# Patient Record
Sex: Female | Born: 1950
Health system: Southern US, Community
[De-identification: ages and names within clinical notes are randomized; demographics above are authoritative.]

## PROBLEM LIST (undated history)

## (undated) DIAGNOSIS — M810 Age-related osteoporosis without current pathological fracture: Secondary | ICD-10-CM

## (undated) DIAGNOSIS — H40009 Preglaucoma, unspecified, unspecified eye: Secondary | ICD-10-CM

## (undated) DIAGNOSIS — I480 Paroxysmal atrial fibrillation: Secondary | ICD-10-CM

## (undated) DIAGNOSIS — E785 Hyperlipidemia, unspecified: Secondary | ICD-10-CM

## (undated) DIAGNOSIS — I38 Endocarditis, valve unspecified: Secondary | ICD-10-CM

## (undated) DIAGNOSIS — Z789 Other specified health status: Secondary | ICD-10-CM

## (undated) HISTORY — PX: TONSILLECTOMY: SUR1361

## (undated) HISTORY — DX: Preglaucoma, unspecified, unspecified eye: H40.009

## (undated) HISTORY — DX: Endocarditis, valve unspecified: I38

## (undated) HISTORY — PX: OTHER SURGICAL HISTORY: SHX169

## (undated) HISTORY — DX: Other specified health status: Z78.9

## (undated) HISTORY — DX: Paroxysmal atrial fibrillation: I48.0

---

## 2001-09-15 ENCOUNTER — Ambulatory Visit (HOSPITAL_COMMUNITY): Admission: RE | Admit: 2001-09-15 | Discharge: 2001-09-15 | Payer: Self-pay | Admitting: Obstetrics and Gynecology

## 2001-09-15 ENCOUNTER — Encounter: Payer: Self-pay | Admitting: Obstetrics and Gynecology

## 2002-11-11 ENCOUNTER — Encounter: Payer: Self-pay | Admitting: Obstetrics and Gynecology

## 2002-11-11 ENCOUNTER — Ambulatory Visit (HOSPITAL_COMMUNITY): Admission: RE | Admit: 2002-11-11 | Discharge: 2002-11-11 | Payer: Self-pay | Admitting: Obstetrics and Gynecology

## 2003-05-06 ENCOUNTER — Other Ambulatory Visit: Admission: RE | Admit: 2003-05-06 | Discharge: 2003-05-06 | Payer: Self-pay | Admitting: Unknown Physician Specialty

## 2003-11-04 ENCOUNTER — Other Ambulatory Visit: Admission: RE | Admit: 2003-11-04 | Discharge: 2003-11-04 | Payer: Self-pay | Admitting: Unknown Physician Specialty

## 2004-03-16 ENCOUNTER — Ambulatory Visit (HOSPITAL_COMMUNITY): Admission: RE | Admit: 2004-03-16 | Discharge: 2004-03-16 | Payer: Self-pay | Admitting: Obstetrics and Gynecology

## 2005-03-20 ENCOUNTER — Ambulatory Visit (HOSPITAL_COMMUNITY): Admission: RE | Admit: 2005-03-20 | Discharge: 2005-03-20 | Payer: Self-pay | Admitting: Obstetrics and Gynecology

## 2005-10-01 ENCOUNTER — Ambulatory Visit: Payer: Self-pay | Admitting: Internal Medicine

## 2005-10-01 ENCOUNTER — Ambulatory Visit (HOSPITAL_COMMUNITY): Admission: RE | Admit: 2005-10-01 | Discharge: 2005-10-01 | Payer: Self-pay | Admitting: Internal Medicine

## 2006-03-26 ENCOUNTER — Ambulatory Visit (HOSPITAL_COMMUNITY): Admission: RE | Admit: 2006-03-26 | Discharge: 2006-03-26 | Payer: Self-pay | Admitting: Obstetrics and Gynecology

## 2007-03-31 ENCOUNTER — Ambulatory Visit (HOSPITAL_COMMUNITY): Admission: RE | Admit: 2007-03-31 | Discharge: 2007-03-31 | Payer: Self-pay | Admitting: Internal Medicine

## 2008-04-13 ENCOUNTER — Ambulatory Visit (HOSPITAL_COMMUNITY): Admission: RE | Admit: 2008-04-13 | Discharge: 2008-04-13 | Payer: Self-pay | Admitting: Internal Medicine

## 2009-04-18 ENCOUNTER — Ambulatory Visit (HOSPITAL_COMMUNITY): Admission: RE | Admit: 2009-04-18 | Discharge: 2009-04-18 | Payer: Self-pay | Admitting: Internal Medicine

## 2010-05-01 ENCOUNTER — Ambulatory Visit (HOSPITAL_COMMUNITY): Admission: RE | Admit: 2010-05-01 | Discharge: 2010-05-01 | Payer: Self-pay | Admitting: Internal Medicine

## 2011-05-09 ENCOUNTER — Other Ambulatory Visit (HOSPITAL_COMMUNITY): Payer: Self-pay | Admitting: Internal Medicine

## 2011-05-09 DIAGNOSIS — Z139 Encounter for screening, unspecified: Secondary | ICD-10-CM

## 2011-05-11 ENCOUNTER — Ambulatory Visit (HOSPITAL_COMMUNITY)
Admission: RE | Admit: 2011-05-11 | Discharge: 2011-05-11 | Disposition: A | Discharge status: Home / Self Care | Payer: Federal, State, Local not specified - PPO | Source: Ambulatory Visit | Attending: Internal Medicine | Admitting: Internal Medicine

## 2011-05-11 DIAGNOSIS — Z139 Encounter for screening, unspecified: Secondary | ICD-10-CM

## 2011-05-11 DIAGNOSIS — Z1231 Encounter for screening mammogram for malignant neoplasm of breast: Secondary | ICD-10-CM | POA: Insufficient documentation

## 2011-05-18 NOTE — Op Note (Signed)
NAME:  Gloria Stewart, Gloria Stewart              ACCOUNT NO.:  [id number]   MEDICAL RECORD NO.:  [id number]          PATIENT TYPE:  AMB   LOCATION:  DAY                           FACILITY:  [facility]   PHYSICIAN:  R. Roetta Sessions, M.D. DATE OF BIRTH:  January 15, 1951   DATE OF PROCEDURE:  10/01/2005  DATE OF DISCHARGE:                                 OPERATIVE REPORT   PROCEDURE:  Screening colonoscopy.   ENDOSCOPIST:  Jonathon Bellows, M.D.   INDICATION FOR PROCEDURE:  The patient is a 60 year old lady sent over at  the courtesy of Dr. Elvina Sidle for colorectal cancer screening.  She is  devoid of any lower GI tract symptoms.  There is no family history of  colorectal neoplasia.  She has never had a colonoscopy previously.  Colonoscopy is now being done as a screening maneuver.  This approach has  been discussed with the patient at length.  Potential risks, benefits, and  alternatives have been reviewed and questions answered.  She is agreeable to  proceed.  Documentation in the medical record.   DESCRIPTION OF PROCEDURE:  O2 saturation, blood pressure, pulse, and  respirations were monitored throughout the entire procedure.  Conscious  sedation with Versed 5 mg IV, Demerol 100 mg IV in divided doses.   INSTRUMENT:  Olympus video chip system.   FINDINGS:  Digital rectal exam revealed no abnormalities.   ENDOSCOPIC FINDINGS:  Prep was good.  Rectum:  Examination of the rectal  mucosa included retroflexed view of the anal verge that revealed no  abnormalities.  Colonic mucosa was surveyed from the rectosigmoid junction  through the left transverse, right colon, appendiceal orifice, ileocecal  valve, and cecum.  These structures were biopsied and photographed for the  record.  From this level, the scope was slowly withdrawn.  All previously  mentioned mucosal surfaces were again seen.  The patient was noted to have a  sigmoid diverticulum.  The remainder of colonic mucosa appeared normal.  The  patient tolerated the procedure well, was reactive in endoscopy.   IMPRESSION:  1.  Normal rectum.  2.  Sigmoid diverticula.  3.  The remainder of colonic mucosa appeared normal.   RECOMMENDATIONS:  1.  Diverticulosis literature given to Ms. Clovis Riley.  2.  Repeat screening colonoscopy in 10 years.      Jonathon Bellows, M.D.  Electronically Signed     RMR/MEDQ  D:  10/01/2005  T:  10/01/2005  Job:  [id number]   cc:   Elvina Sidle, M.D.  Fax: [phone]

## 2012-05-12 ENCOUNTER — Other Ambulatory Visit (HOSPITAL_COMMUNITY): Payer: Self-pay | Admitting: Internal Medicine

## 2012-05-12 DIAGNOSIS — Z139 Encounter for screening, unspecified: Secondary | ICD-10-CM

## 2012-05-13 ENCOUNTER — Ambulatory Visit (HOSPITAL_COMMUNITY)
Admission: RE | Admit: 2012-05-13 | Discharge: 2012-05-13 | Disposition: A | Discharge status: Home / Self Care | Payer: Federal, State, Local not specified - PPO | Source: Ambulatory Visit | Attending: Internal Medicine | Admitting: Internal Medicine

## 2012-05-13 DIAGNOSIS — Z139 Encounter for screening, unspecified: Secondary | ICD-10-CM

## 2012-05-13 DIAGNOSIS — Z1231 Encounter for screening mammogram for malignant neoplasm of breast: Secondary | ICD-10-CM | POA: Insufficient documentation

## 2013-05-18 ENCOUNTER — Other Ambulatory Visit (HOSPITAL_COMMUNITY): Payer: Self-pay | Admitting: Internal Medicine

## 2013-05-18 DIAGNOSIS — Z139 Encounter for screening, unspecified: Secondary | ICD-10-CM

## 2013-05-21 ENCOUNTER — Other Ambulatory Visit (HOSPITAL_COMMUNITY): Payer: Self-pay | Admitting: Internal Medicine

## 2013-05-21 ENCOUNTER — Ambulatory Visit (HOSPITAL_COMMUNITY)
Admission: RE | Admit: 2013-05-21 | Discharge: 2013-05-21 | Disposition: A | Discharge status: Home / Self Care | Payer: Federal, State, Local not specified - PPO | Source: Ambulatory Visit | Attending: Internal Medicine | Admitting: Internal Medicine

## 2013-05-21 DIAGNOSIS — Z139 Encounter for screening, unspecified: Secondary | ICD-10-CM

## 2013-05-21 DIAGNOSIS — M810 Age-related osteoporosis without current pathological fracture: Secondary | ICD-10-CM

## 2013-05-21 DIAGNOSIS — Z1231 Encounter for screening mammogram for malignant neoplasm of breast: Secondary | ICD-10-CM | POA: Insufficient documentation

## 2013-05-27 ENCOUNTER — Ambulatory Visit (HOSPITAL_COMMUNITY)
Admission: RE | Admit: 2013-05-27 | Discharge: 2013-05-27 | Disposition: A | Discharge status: Home / Self Care | Payer: Federal, State, Local not specified - PPO | Source: Ambulatory Visit | Attending: Internal Medicine | Admitting: Internal Medicine

## 2013-05-27 DIAGNOSIS — M949 Disorder of cartilage, unspecified: Secondary | ICD-10-CM | POA: Insufficient documentation

## 2013-05-27 DIAGNOSIS — M810 Age-related osteoporosis without current pathological fracture: Secondary | ICD-10-CM

## 2013-05-27 DIAGNOSIS — M899 Disorder of bone, unspecified: Secondary | ICD-10-CM | POA: Insufficient documentation

## 2014-06-01 ENCOUNTER — Other Ambulatory Visit (HOSPITAL_COMMUNITY): Payer: Self-pay | Admitting: Internal Medicine

## 2014-06-01 DIAGNOSIS — Z1231 Encounter for screening mammogram for malignant neoplasm of breast: Secondary | ICD-10-CM

## 2014-06-03 ENCOUNTER — Ambulatory Visit (HOSPITAL_COMMUNITY)
Admission: RE | Admit: 2014-06-03 | Discharge: 2014-06-03 | Disposition: A | Discharge status: Home / Self Care | Payer: Federal, State, Local not specified - PPO | Source: Ambulatory Visit | Attending: Internal Medicine | Admitting: Internal Medicine

## 2014-06-03 DIAGNOSIS — Z1231 Encounter for screening mammogram for malignant neoplasm of breast: Secondary | ICD-10-CM

## 2015-06-24 ENCOUNTER — Other Ambulatory Visit (HOSPITAL_COMMUNITY): Payer: Self-pay | Admitting: Internal Medicine

## 2015-06-24 DIAGNOSIS — Z1231 Encounter for screening mammogram for malignant neoplasm of breast: Secondary | ICD-10-CM

## 2015-06-29 ENCOUNTER — Ambulatory Visit (HOSPITAL_COMMUNITY)
Admission: RE | Admit: 2015-06-29 | Discharge: 2015-06-29 | Disposition: A | Discharge status: Home / Self Care | Payer: Federal, State, Local not specified - PPO | Source: Ambulatory Visit | Attending: Internal Medicine | Admitting: Internal Medicine

## 2015-06-29 DIAGNOSIS — Z1231 Encounter for screening mammogram for malignant neoplasm of breast: Secondary | ICD-10-CM | POA: Diagnosis not present

## 2015-11-01 DIAGNOSIS — H18899 Other specified disorders of cornea, unspecified eye: Secondary | ICD-10-CM | POA: Diagnosis not present

## 2015-11-01 DIAGNOSIS — H43819 Vitreous degeneration, unspecified eye: Secondary | ICD-10-CM | POA: Diagnosis not present

## 2015-11-01 DIAGNOSIS — H40009 Preglaucoma, unspecified, unspecified eye: Secondary | ICD-10-CM | POA: Diagnosis not present

## 2015-11-01 DIAGNOSIS — H52223 Regular astigmatism, bilateral: Secondary | ICD-10-CM | POA: Diagnosis not present

## 2015-11-01 DIAGNOSIS — H5203 Hypermetropia, bilateral: Secondary | ICD-10-CM | POA: Diagnosis not present

## 2015-11-01 DIAGNOSIS — H43813 Vitreous degeneration, bilateral: Secondary | ICD-10-CM | POA: Diagnosis not present

## 2015-11-01 DIAGNOSIS — H524 Presbyopia: Secondary | ICD-10-CM | POA: Diagnosis not present

## 2015-11-11 DIAGNOSIS — R7301 Impaired fasting glucose: Secondary | ICD-10-CM | POA: Diagnosis not present

## 2015-11-11 DIAGNOSIS — E559 Vitamin D deficiency, unspecified: Secondary | ICD-10-CM | POA: Diagnosis not present

## 2015-11-11 DIAGNOSIS — E785 Hyperlipidemia, unspecified: Secondary | ICD-10-CM | POA: Diagnosis not present

## 2015-11-11 DIAGNOSIS — Z131 Encounter for screening for diabetes mellitus: Secondary | ICD-10-CM | POA: Diagnosis not present

## 2015-11-16 DIAGNOSIS — Z2821 Immunization not carried out because of patient refusal: Secondary | ICD-10-CM | POA: Diagnosis not present

## 2015-11-16 DIAGNOSIS — E782 Mixed hyperlipidemia: Secondary | ICD-10-CM | POA: Diagnosis not present

## 2015-12-29 ENCOUNTER — Telehealth: Payer: Self-pay | Admitting: Internal Medicine

## 2015-12-29 NOTE — Telephone Encounter (Signed)
I called pt to triage for her colonoscopy. She said she has constipation and has a Bm about every 2 days and has to strain. Ov scheduled with Wynne DustEric Gill, NP on 01/17/2016.

## 2015-12-29 NOTE — Telephone Encounter (Signed)
Pt called to say that RMR did her colonoscopy 10 years ago and she hasn't heard from us to schedule her next one. I told her the triage nurse was at lunch and she does her triages from 130-430 and I would take her number so she could call her back. I have requested the last colonoscopy and [facility] because patient was not on our recall list and I don't see what the recommendations were from back then. Please call [phone]

## 2015-12-29 NOTE — Telephone Encounter (Signed)
The colonoscopy report is on your desk MelbourneDoris. I found it under 2012, She had it done 10/01/2005 and wasn't nic'ed for her 10 year due in October.

## 2016-01-17 ENCOUNTER — Encounter: Payer: Self-pay | Admitting: Nurse Practitioner

## 2016-01-17 ENCOUNTER — Other Ambulatory Visit: Payer: Self-pay

## 2016-01-17 ENCOUNTER — Ambulatory Visit (INDEPENDENT_AMBULATORY_CARE_PROVIDER_SITE_OTHER): Payer: Medicare Other | Admitting: Nurse Practitioner

## 2016-01-17 VITALS — BP 112/74 | HR 64 | Temp 97.0°F | Ht 69.0 in | Wt 153.0 lb

## 2016-01-17 DIAGNOSIS — Z1211 Encounter for screening for malignant neoplasm of colon: Secondary | ICD-10-CM

## 2016-01-17 DIAGNOSIS — K5900 Constipation, unspecified: Secondary | ICD-10-CM | POA: Diagnosis not present

## 2016-01-17 MED ORDER — PEG 3350-KCL-NA BICARB-NACL 420 G PO SOLR: 4000.0000 mL | ORAL | Status: DC

## 2016-01-17 NOTE — Assessment & Plan Note (Signed)
Patient with intermittent constipation over the holidays due to change routine and change in dietary habits. Now she is back to exercise regularly, drinking water, eating better foods which are high in fiber her constipation has resolved. Recommend over-the-counter MiraLAX or stool softeners as needed for intermittent constipation. Return for follow-up as needed.

## 2016-01-17 NOTE — Assessment & Plan Note (Signed)
Patient due for 10 year repeat screening colonoscopy. Last colonoscopy completed 10 years ago which was normal. This point we'll proceed with recommended screening colonoscopy.  Proceed with TCS with Dr. Jena Gauss in near future: the risks, benefits, and alternatives have been discussed with the patient in detail. The patient states understanding and desires to proceed.  The patient is not on any medications other than vitamins. Conscious sedation should be adequate for her procedure.

## 2016-01-17 NOTE — Progress Notes (Signed)
Primary Care Physician:  Dwana Melena, MD Primary Gastroenterologist:  Dr. Jena Gauss  Chief Complaint  Patient presents with  . Colonoscopy  . Constipation    HPI:   65 year old female presents for repeat screening colonoscopy. Last colonoscopy completed 10/01/2005 and found normal rectum, normal colonic mucosa. Recommend repeat screening colonoscopy in 10 years. Also noted to have sigmoid diverticula. She was attempted phone triage however she was brought in the office because of complaints of constipation.  Today she states she is no longer having constipation. States she was constipated around the holidays with an interruption in her normal routine. Now that she is back to walking, adequate water, and fiber she is no longer constipated. Denies abdominal pain, N/V, hematochezia, melena, fever, chills, unintentional weight loss. Denies chest pain, dyspnea, dizziness, lightheadedness, syncope, near syncope. Denies any other upper or lower GI symptoms.  Past Medical History  Diagnosis Date  . Medical history non-contributory     No PMH per patient.    Past Surgical History  Procedure Laterality Date  . None to date      01/17/16    Current Outpatient Prescriptions  Medication Sig Dispense Refill  . calcium carbonate (OS-CAL - DOSED IN MG OF ELEMENTAL CALCIUM) 1250 (500 Ca) MG tablet Take 1 tablet by mouth.    . cholecalciferol (VITAMIN D) 1000 units tablet Take 1,000 Units by mouth daily.     No current facility-administered medications for this visit.    Allergies as of 01/17/2016  . (No Known Allergies)    Family History  Problem Relation Age of Onset  . Colon cancer Neg Hx     Social History   Social History  . Marital Status: Single    Spouse Name: N/A  . Number of Children: N/A  . Years of Education: N/A   Occupational History  . Not on file.   Social History Main Topics  . Smoking status: Former Smoker    Quit date: 01/16/1981  . Smokeless tobacco: Never  Used  . Alcohol Use: 0.0 oz/week    0 Standard drinks or equivalent per week     Comment: 3-4 glasses of wine a week  . Drug Use: No  . Sexual Activity: Not on file   Other Topics Concern  . Not on file   Social History Narrative  . No narrative on file    Review of Systems: General: Negative for anorexia, weight loss, fever, chills, fatigue, weakness. Eyes: Negative for vision changes.  ENT: Negative for hoarseness, difficulty swallowing. CV: Negative for chest pain, angina, palpitations, peripheral edema.  Respiratory: Negative for dyspnea at rest, cough, sputum, wheezing.  GI: See history of present illness. MS: Negative for joint pain, low back pain.  Derm: Negative for rash or itching.  Endo: Negative for unusual weight change.  Heme: Negative for bruising or bleeding.    Physical Exam: BP 112/74 mmHg  Pulse 64  Temp(Src) 97 F (36.1 C) (Oral)  Ht  (1.753 m)  Wt 153 lb (69.4 kg)  BMI 22.58 kg/m2 General:   Alert and oriented. Pleasant and cooperative. Well-nourished and well-developed.  Head:  Normocephalic and atraumatic. Eyes:  Without icterus, sclera clear and conjunctiva pink.  Ears:  Normal auditory acuity. Cardiovascular:  S1, S2 present without murmurs appreciated. Extremities without clubbing or edema. Respiratory:  Clear to auscultation bilaterally. No wheezes, rales, or rhonchi. No distress.  Gastrointestinal:  +BS, soft, non-tender and non-distended. No HSM noted. No guarding or rebound. No masses appreciated.  Rectal:  Deferred  Neurologic:  Alert and oriented x4;  grossly normal neurologically. Psych:  Alert and cooperative. Normal mood and affect. Heme/Lymph/Immune: No excessive bruising noted.    01/17/2016 2:48 PM

## 2016-01-17 NOTE — Patient Instructions (Signed)
1. We will schedule your procedure for you. 2. Continue doing all the great things are doing to help prevent your constipation from coming back.  3. Return for follow-up as needed.

## 2016-01-24 NOTE — Progress Notes (Signed)
CC'ED TO PCP 

## 2016-02-07 ENCOUNTER — Other Ambulatory Visit: Payer: Self-pay

## 2016-02-09 ENCOUNTER — Encounter (HOSPITAL_COMMUNITY)
Admission: RE | Disposition: A | Discharge status: Home / Self Care | Payer: Self-pay | Source: Ambulatory Visit | Attending: Internal Medicine

## 2016-02-09 ENCOUNTER — Encounter (HOSPITAL_COMMUNITY): Payer: Self-pay

## 2016-02-09 ENCOUNTER — Ambulatory Visit (HOSPITAL_COMMUNITY)
Admission: RE | Admit: 2016-02-09 | Discharge: 2016-02-09 | Disposition: A | Discharge status: Home / Self Care | Payer: Medicare Other | Source: Ambulatory Visit | Attending: Internal Medicine | Admitting: Internal Medicine

## 2016-02-09 DIAGNOSIS — Z87891 Personal history of nicotine dependence: Secondary | ICD-10-CM | POA: Diagnosis not present

## 2016-02-09 DIAGNOSIS — Z1211 Encounter for screening for malignant neoplasm of colon: Secondary | ICD-10-CM | POA: Diagnosis not present

## 2016-02-09 DIAGNOSIS — Q2733 Arteriovenous malformation of digestive system vessel: Secondary | ICD-10-CM | POA: Diagnosis not present

## 2016-02-09 DIAGNOSIS — K5730 Diverticulosis of large intestine without perforation or abscess without bleeding: Secondary | ICD-10-CM | POA: Insufficient documentation

## 2016-02-09 DIAGNOSIS — Q438 Other specified congenital malformations of intestine: Secondary | ICD-10-CM | POA: Diagnosis not present

## 2016-02-09 HISTORY — PX: COLONOSCOPY: SHX5424

## 2016-02-09 SURGERY — COLONOSCOPY
Anesthesia: Moderate Sedation

## 2016-02-09 MED ORDER — ONDANSETRON HCL 4 MG/2ML IJ SOLN
INTRAMUSCULAR | Status: AC
  Filled 2016-02-09: qty 2

## 2016-02-09 MED ORDER — ONDANSETRON HCL 4 MG/2ML IJ SOLN
INTRAMUSCULAR | Status: DC | PRN
  Administered 2016-02-09: 4 mg via INTRAVENOUS

## 2016-02-09 MED ORDER — SIMETHICONE 40 MG/0.6ML PO SUSP
ORAL | Status: DC | PRN
  Administered 2016-02-09: 2.5 mL

## 2016-02-09 MED ORDER — SODIUM CHLORIDE 0.9 % IV SOLN
INTRAVENOUS | Status: DC
  Administered 2016-02-09: 10:00:00 via INTRAVENOUS

## 2016-02-09 MED ORDER — MIDAZOLAM HCL 5 MG/5ML IJ SOLN
INTRAMUSCULAR | Status: AC
  Filled 2016-02-09: qty 10

## 2016-02-09 MED ORDER — MEPERIDINE HCL 100 MG/ML IJ SOLN
INTRAMUSCULAR | Status: AC
  Filled 2016-02-09: qty 2

## 2016-02-09 MED ORDER — MIDAZOLAM HCL 5 MG/5ML IJ SOLN
INTRAMUSCULAR | Status: DC | PRN
  Administered 2016-02-09: 1 mg via INTRAVENOUS
  Administered 2016-02-09: 2 mg via INTRAVENOUS
  Administered 2016-02-09: 1 mg via INTRAVENOUS
  Administered 2016-02-09: 2 mg via INTRAVENOUS
  Administered 2016-02-09: 1 mg via INTRAVENOUS

## 2016-02-09 MED ORDER — MEPERIDINE HCL 100 MG/ML IJ SOLN
INTRAMUSCULAR | Status: DC | PRN
  Administered 2016-02-09 (×2): 25 mg via INTRAVENOUS
  Administered 2016-02-09: 50 mg via INTRAVENOUS

## 2016-02-09 NOTE — Interval H&P Note (Signed)
History and Physical Interval Note:  02/09/2016 10:55 AM  Gloria Stewart  has presented today for surgery, with the diagnosis of SCREENING  The various methods of treatment have been discussed with the patient and family. After consideration of risks, benefits and other options for treatment, the patient has consented to  Procedure(s) with comments: COLONOSCOPY (N/A) - 900 - moved to 10:15 - office to notify as a surgical intervention .  The patient's history has been reviewed, patient examined, no change in status, stable for surgery.  I have reviewed the patient's chart and labs.  Questions were answered to the patient's satisfaction.     Buryl Bamber  No change. Average risk screening colonoscopy per plan.  The risks, benefits, limitations, alternatives and imponderables have been reviewed with the patient. Questions have been answered. All parties are agreeable.

## 2016-02-09 NOTE — H&P (View-Only) (Signed)
Primary Care Physician:  Dwana Melena, MD Primary Gastroenterologist:  Dr. Jena Gauss  Chief Complaint  Patient presents with  . Colonoscopy  . Constipation    HPI:   65 year old female presents for repeat screening colonoscopy. Last colonoscopy completed 10/01/2005 and found normal rectum, normal colonic mucosa. Recommend repeat screening colonoscopy in 10 years. Also noted to have sigmoid diverticula. She was attempted phone triage however she was brought in the office because of complaints of constipation.  Today she states she is no longer having constipation. States she was constipated around the holidays with an interruption in her normal routine. Now that she is back to walking, adequate water, and fiber she is no longer constipated. Denies abdominal pain, N/V, hematochezia, melena, fever, chills, unintentional weight loss. Denies chest pain, dyspnea, dizziness, lightheadedness, syncope, near syncope. Denies any other upper or lower GI symptoms.  Past Medical History  Diagnosis Date  . Medical history non-contributory     No PMH per patient.    Past Surgical History  Procedure Laterality Date  . None to date      01/17/16    Current Outpatient Prescriptions  Medication Sig Dispense Refill  . calcium carbonate (OS-CAL - DOSED IN MG OF ELEMENTAL CALCIUM) 1250 (500 Ca) MG tablet Take 1 tablet by mouth.    . cholecalciferol (VITAMIN D) 1000 units tablet Take 1,000 Units by mouth daily.     No current facility-administered medications for this visit.    Allergies as of 01/17/2016  . (No Known Allergies)    Family History  Problem Relation Age of Onset  . Colon cancer Neg Hx     Social History   Social History  . Marital Status: Single    Spouse Name: N/A  . Number of Children: N/A  . Years of Education: N/A   Occupational History  . Not on file.   Social History Main Topics  . Smoking status: Former Smoker    Quit date: 01/16/1981  . Smokeless tobacco: Never  Used  . Alcohol Use: 0.0 oz/week    0 Standard drinks or equivalent per week     Comment: 3-4 glasses of wine a week  . Drug Use: No  . Sexual Activity: Not on file   Other Topics Concern  . Not on file   Social History Narrative  . No narrative on file    Review of Systems: General: Negative for anorexia, weight loss, fever, chills, fatigue, weakness. Eyes: Negative for vision changes.  ENT: Negative for hoarseness, difficulty swallowing. CV: Negative for chest pain, angina, palpitations, peripheral edema.  Respiratory: Negative for dyspnea at rest, cough, sputum, wheezing.  GI: See history of present illness. MS: Negative for joint pain, low back pain.  Derm: Negative for rash or itching.  Endo: Negative for unusual weight change.  Heme: Negative for bruising or bleeding.    Physical Exam: BP 112/74 mmHg  Pulse 64  Temp(Src) 97 F (36.1 C) (Oral)  Ht  (1.753 m)  Wt 153 lb (69.4 kg)  BMI 22.58 kg/m2 General:   Alert and oriented. Pleasant and cooperative. Well-nourished and well-developed.  Head:  Normocephalic and atraumatic. Eyes:  Without icterus, sclera clear and conjunctiva pink.  Ears:  Normal auditory acuity. Cardiovascular:  S1, S2 present without murmurs appreciated. Extremities without clubbing or edema. Respiratory:  Clear to auscultation bilaterally. No wheezes, rales, or rhonchi. No distress.  Gastrointestinal:  +BS, soft, non-tender and non-distended. No HSM noted. No guarding or rebound. No masses appreciated.  Rectal:  Deferred  Neurologic:  Alert and oriented x4;  grossly normal neurologically. Psych:  Alert and cooperative. Normal mood and affect. Heme/Lymph/Immune: No excessive bruising noted.    01/17/2016 2:48 PM

## 2016-02-09 NOTE — Op Note (Signed)
[facility] [address] [place], [address]   COLONOSCOPY PROCEDURE REPORT  PATIENT: Gloria Stewart, Gloria Stewart  MR#: [id number] BIRTHDATE: [birth year] , 64  yrs. old GENDER: female ENDOSCOPIST: R.  Roetta Sessions, MD FACP [facility] REFERRED XB:JYNW Hall, M.D. PROCEDURE DATE:  02-28-16 PROCEDURE:   Colonoscopy, screening INDICATIONS:Average risk colorectal cancer screening examination. MEDICATIONS: Versed 7 mg IV and Demerol 100 mg IV in divided doses. Zofran 4 mg IV. ASA CLASS:       Class II  CONSENT: The risks, benefits, alternatives and imponderables including but not limited to bleeding, perforation as well as the possibility of a missed lesion have been reviewed.  The potential for biopsy, lesion removal, etc. have also been discussed. Questions have been answered.  All parties agreeable.  Please see the history and physical in the medical record for more information.  DESCRIPTION OF PROCEDURE:   After the risks benefits and alternatives of the procedure were thoroughly explained, informed consent was obtained.  The digital rectal exam revealed no abnormalities of the rectum.   The EC-[id number] ([id number])  endoscope was introduced through the anus and advanced to the cecum, which was identified by both the appendix and ileocecal valve. No adverse events experienced.   The quality of the prep was adequate  The instrument was then slowly withdrawn as the colon was fully examined. Estimated blood loss is zero unless otherwise noted in this procedure report.      COLON FINDINGS: Normal-appearing rectal mucosa.  Scattered sigmoid diverticula; (1) 3 mm AVM in the descending colon.  The remainder of the colonic mucosa appeared normal.  Colon was redundant requiring changing of the patient's position and external abdominal pressure to reach the cecum.  Retroflexion was performed. .  Withdrawal time=8 minutes 0 seconds.  The scope was withdrawn and the procedure  completed. COMPLICATIONS: There were no immediate complications.  ENDOSCOPIC IMPRESSION: Colonic diverticulosis. Redundant colon.  RECOMMENDATIONS: Return for 1 more screening colonoscopy in 10 years  eSigned:  R. Roetta Sessions, MD Jerrel Ivory [facility] 02/28/16 11:33 AM   cc:  CPT CODES: ICD CODES:  The ICD and CPT codes recommended by this software are interpretations from the data that the clinical staff has captured with the software.  The verification of the translation of this report to the ICD and CPT codes and modifiers is the sole responsibility of the health care institution and practicing physician where this report was generated.  [employer] will not be held responsible for the validity of the ICD and CPT codes included on this report.  AMA assumes no liability for data contained or not contained herein. CPT is a Publishing rights manager of the [employer].  PATIENT NAME:  [name], [name] MR#: [id number]

## 2016-02-09 NOTE — Discharge Instructions (Signed)
Colonoscopy Discharge Instructions  Read the instructions outlined below and refer to this sheet in the next few weeks. These discharge instructions provide you with general information on caring for yourself after you leave the hospital. Your doctor may also give you specific instructions. While your treatment has been planned according to the most current medical practices available, unavoidable complications occasionally occur. If you have any problems or questions after discharge, call Dr. Jena Gauss at [phone]. ACTIVITY  You may resume your regular activity, but move at a slower pace for the next 24 hours.   Take frequent rest periods for the next 24 hours.   Walking will help get rid of the air and reduce the bloated feeling in your belly (abdomen).   No driving for 24 hours (because of the medicine (anesthesia) used during the test).    Do not sign any important legal documents or operate any machinery for 24 hours (because of the anesthesia used during the test).  NUTRITION  Drink plenty of fluids.   You may resume your normal diet as instructed by your doctor.   Begin with a light meal and progress to your normal diet. Heavy or fried foods are harder to digest and may make you feel sick to your stomach (nauseated).   Avoid alcoholic beverages for 24 hours or as instructed.  MEDICATIONS  You may resume your normal medications unless your doctor tells you otherwise.  WHAT YOU CAN EXPECT TODAY  Some feelings of bloating in the abdomen.   Passage of more gas than usual.   Spotting of blood in your stool or on the toilet paper.  IF YOU HAD POLYPS REMOVED DURING THE COLONOSCOPY:  No aspirin products for 7 days or as instructed.   No alcohol for 7 days or as instructed.   Eat a soft diet for the next 24 hours.  FINDING OUT THE RESULTS OF YOUR TEST Not all test results are available during your visit. If your test results are not back during the visit, make an appointment  with your caregiver to find out the results. Do not assume everything is normal if you have not heard from your caregiver or the medical facility. It is important for you to follow up on all of your test results.  SEEK IMMEDIATE MEDICAL ATTENTION IF:  You have more than a spotting of blood in your stool.   Your belly is swollen (abdominal distention).   You are nauseated or vomiting.   You have a temperature over 101.   You have abdominal pain or discomfort that is severe or gets worse throughout the day.    You have colonic diverticulosis. Information on diverticulosis provided  No polyps found today  Return for 1 more screening colonoscopy in 10 years   Diverticulosis Diverticulosis is the condition that develops when small pouches (diverticula) form in the wall of your colon. Your colon, or large intestine, is where water is absorbed and stool is formed. The pouches form when the inside layer of your colon pushes through weak spots in the outer layers of your colon. CAUSES  No one knows exactly what causes diverticulosis. RISK FACTORS  Being older than 50. Your risk for this condition increases with age. Diverticulosis is rare in people younger than 40 years. By age 12, almost everyone has it.  Eating a low-fiber diet.  Being frequently constipated.  Being overweight.  Not getting enough exercise.  Smoking.  Taking over-the-counter pain medicines, like aspirin and ibuprofen. SYMPTOMS  Most people  with diverticulosis do not have symptoms. DIAGNOSIS  Because diverticulosis often has no symptoms, health care providers often discover the condition during an exam for other colon problems. In many cases, a health care provider will diagnose diverticulosis while using a flexible scope to examine the colon (colonoscopy). TREATMENT  If you have never developed an infection related to diverticulosis, you may not need treatment. If you have had an infection before, treatment may  include:  Eating more fruits, vegetables, and grains.  Taking a fiber supplement.  Taking a live bacteria supplement (probiotic).  Taking medicine to relax your colon. HOME CARE INSTRUCTIONS   Drink at least 6-8 glasses of water each day to prevent constipation.  Try not to strain when you have a bowel movement.  Keep all follow-up appointments. If you have had an infection before:  Increase the fiber in your diet as directed by your health care provider or dietitian.  Take a dietary fiber supplement if your health care provider approves.  Only take medicines as directed by your health care provider. SEEK MEDICAL CARE IF:   You have abdominal pain.  You have bloating.  You have cramps.  You have not gone to the bathroom in 3 days. SEEK IMMEDIATE MEDICAL CARE IF:   Your pain gets worse.  Yourbloating becomes very bad.  You have a fever or chills, and your symptoms suddenly get worse.  You begin vomiting.  You have bowel movements that are bloody or black. MAKE SURE YOU:  Understand these instructions.  Will watch your condition.  Will get help right away if you are not doing well or get worse.   This information is not intended to replace advice given to you by your health care provider. Make sure you discuss any questions you have with your health care provider.   Document Released: 09/13/2004 Document Revised: 12/22/2013 Document Reviewed: 11/11/2013 Elsevier Interactive Patient Education [employer]

## 2016-02-10 ENCOUNTER — Encounter (HOSPITAL_COMMUNITY): Payer: Self-pay | Admitting: Internal Medicine

## 2016-05-23 DIAGNOSIS — E559 Vitamin D deficiency, unspecified: Secondary | ICD-10-CM | POA: Diagnosis not present

## 2016-05-23 DIAGNOSIS — E782 Mixed hyperlipidemia: Secondary | ICD-10-CM | POA: Diagnosis not present

## 2016-05-25 DIAGNOSIS — E782 Mixed hyperlipidemia: Secondary | ICD-10-CM | POA: Diagnosis not present

## 2016-05-25 DIAGNOSIS — Z0000 Encounter for general adult medical examination without abnormal findings: Secondary | ICD-10-CM | POA: Diagnosis not present

## 2016-06-29 ENCOUNTER — Other Ambulatory Visit (HOSPITAL_COMMUNITY): Payer: Self-pay | Admitting: Internal Medicine

## 2016-06-29 DIAGNOSIS — Z1231 Encounter for screening mammogram for malignant neoplasm of breast: Secondary | ICD-10-CM

## 2016-07-04 ENCOUNTER — Ambulatory Visit (HOSPITAL_COMMUNITY)
Admission: RE | Admit: 2016-07-04 | Discharge: 2016-07-04 | Disposition: A | Discharge status: Home / Self Care | Payer: Medicare Other | Source: Ambulatory Visit | Attending: Internal Medicine | Admitting: Internal Medicine

## 2016-07-04 DIAGNOSIS — Z1231 Encounter for screening mammogram for malignant neoplasm of breast: Secondary | ICD-10-CM | POA: Insufficient documentation

## 2017-01-04 DIAGNOSIS — H524 Presbyopia: Secondary | ICD-10-CM | POA: Diagnosis not present

## 2017-01-04 DIAGNOSIS — H5203 Hypermetropia, bilateral: Secondary | ICD-10-CM | POA: Diagnosis not present

## 2017-01-04 DIAGNOSIS — H52223 Regular astigmatism, bilateral: Secondary | ICD-10-CM | POA: Diagnosis not present

## 2017-01-04 DIAGNOSIS — H40013 Open angle with borderline findings, low risk, bilateral: Secondary | ICD-10-CM | POA: Diagnosis not present

## 2017-06-20 DIAGNOSIS — L820 Inflamed seborrheic keratosis: Secondary | ICD-10-CM | POA: Diagnosis not present

## 2017-07-18 ENCOUNTER — Other Ambulatory Visit (HOSPITAL_COMMUNITY): Payer: Self-pay | Admitting: Internal Medicine

## 2017-07-18 DIAGNOSIS — Z1231 Encounter for screening mammogram for malignant neoplasm of breast: Secondary | ICD-10-CM

## 2017-07-25 ENCOUNTER — Ambulatory Visit (HOSPITAL_COMMUNITY)
Admission: RE | Admit: 2017-07-25 | Discharge: 2017-07-25 | Disposition: A | Discharge status: Home / Self Care | Payer: Medicare Other | Source: Ambulatory Visit | Attending: Internal Medicine | Admitting: Internal Medicine

## 2017-07-25 DIAGNOSIS — Z1231 Encounter for screening mammogram for malignant neoplasm of breast: Secondary | ICD-10-CM

## 2017-10-09 DIAGNOSIS — E782 Mixed hyperlipidemia: Secondary | ICD-10-CM | POA: Diagnosis not present

## 2017-10-09 DIAGNOSIS — Z0000 Encounter for general adult medical examination without abnormal findings: Secondary | ICD-10-CM | POA: Diagnosis not present

## 2017-10-09 DIAGNOSIS — E559 Vitamin D deficiency, unspecified: Secondary | ICD-10-CM | POA: Diagnosis not present

## 2017-10-11 DIAGNOSIS — Z0000 Encounter for general adult medical examination without abnormal findings: Secondary | ICD-10-CM | POA: Diagnosis not present

## 2017-10-11 DIAGNOSIS — Z23 Encounter for immunization: Secondary | ICD-10-CM | POA: Diagnosis not present

## 2017-10-11 DIAGNOSIS — Z6822 Body mass index (BMI) 22.0-22.9, adult: Secondary | ICD-10-CM | POA: Diagnosis not present

## 2017-10-11 DIAGNOSIS — M816 Localized osteoporosis [Lequesne]: Secondary | ICD-10-CM | POA: Diagnosis not present

## 2017-10-11 DIAGNOSIS — E559 Vitamin D deficiency, unspecified: Secondary | ICD-10-CM | POA: Diagnosis not present

## 2017-10-11 DIAGNOSIS — E782 Mixed hyperlipidemia: Secondary | ICD-10-CM | POA: Diagnosis not present

## 2018-02-04 DIAGNOSIS — H40003 Preglaucoma, unspecified, bilateral: Secondary | ICD-10-CM | POA: Diagnosis not present

## 2018-02-04 DIAGNOSIS — H18893 Other specified disorders of cornea, bilateral: Secondary | ICD-10-CM | POA: Diagnosis not present

## 2018-02-04 DIAGNOSIS — H43813 Vitreous degeneration, bilateral: Secondary | ICD-10-CM | POA: Diagnosis not present

## 2018-02-04 DIAGNOSIS — H40013 Open angle with borderline findings, low risk, bilateral: Secondary | ICD-10-CM | POA: Diagnosis not present

## 2018-03-24 DIAGNOSIS — Z6821 Body mass index (BMI) 21.0-21.9, adult: Secondary | ICD-10-CM | POA: Diagnosis not present

## 2018-03-24 DIAGNOSIS — J060 Acute laryngopharyngitis: Secondary | ICD-10-CM | POA: Diagnosis not present

## 2018-08-20 ENCOUNTER — Other Ambulatory Visit (HOSPITAL_COMMUNITY): Payer: Self-pay | Admitting: Internal Medicine

## 2018-08-20 DIAGNOSIS — Z1231 Encounter for screening mammogram for malignant neoplasm of breast: Secondary | ICD-10-CM

## 2018-08-27 ENCOUNTER — Ambulatory Visit (HOSPITAL_COMMUNITY)
Admission: RE | Admit: 2018-08-27 | Discharge: 2018-08-27 | Disposition: A | Discharge status: Home / Self Care | Payer: Medicare Other | Source: Ambulatory Visit | Attending: Internal Medicine | Admitting: Internal Medicine

## 2018-08-27 ENCOUNTER — Encounter (HOSPITAL_COMMUNITY): Payer: Self-pay

## 2018-08-27 DIAGNOSIS — Z1231 Encounter for screening mammogram for malignant neoplasm of breast: Secondary | ICD-10-CM | POA: Diagnosis not present

## 2019-02-05 DIAGNOSIS — Z6821 Body mass index (BMI) 21.0-21.9, adult: Secondary | ICD-10-CM | POA: Diagnosis not present

## 2019-02-11 DIAGNOSIS — E782 Mixed hyperlipidemia: Secondary | ICD-10-CM | POA: Diagnosis not present

## 2019-02-11 DIAGNOSIS — Z0000 Encounter for general adult medical examination without abnormal findings: Secondary | ICD-10-CM | POA: Diagnosis not present

## 2019-02-11 DIAGNOSIS — Z23 Encounter for immunization: Secondary | ICD-10-CM | POA: Diagnosis not present

## 2019-08-03 ENCOUNTER — Other Ambulatory Visit: Payer: Self-pay

## 2019-09-04 ENCOUNTER — Other Ambulatory Visit (HOSPITAL_COMMUNITY): Payer: Self-pay | Admitting: Internal Medicine

## 2019-09-04 DIAGNOSIS — Z1231 Encounter for screening mammogram for malignant neoplasm of breast: Secondary | ICD-10-CM

## 2019-09-18 ENCOUNTER — Ambulatory Visit (HOSPITAL_COMMUNITY)
Admission: RE | Admit: 2019-09-18 | Discharge: 2019-09-18 | Disposition: A | Discharge status: Home / Self Care | Payer: Medicare Other | Source: Ambulatory Visit | Attending: Internal Medicine | Admitting: Internal Medicine

## 2019-09-18 ENCOUNTER — Other Ambulatory Visit: Payer: Self-pay

## 2019-09-18 DIAGNOSIS — Z1231 Encounter for screening mammogram for malignant neoplasm of breast: Secondary | ICD-10-CM | POA: Insufficient documentation

## 2020-01-21 DIAGNOSIS — H40013 Open angle with borderline findings, low risk, bilateral: Secondary | ICD-10-CM | POA: Diagnosis not present

## 2020-01-29 ENCOUNTER — Ambulatory Visit: Payer: Medicare Other

## 2020-02-09 ENCOUNTER — Ambulatory Visit: Payer: Medicare Other

## 2020-08-15 DIAGNOSIS — E782 Mixed hyperlipidemia: Secondary | ICD-10-CM | POA: Diagnosis not present

## 2020-08-15 DIAGNOSIS — Z6821 Body mass index (BMI) 21.0-21.9, adult: Secondary | ICD-10-CM | POA: Diagnosis not present

## 2020-08-15 DIAGNOSIS — Z23 Encounter for immunization: Secondary | ICD-10-CM | POA: Diagnosis not present

## 2020-08-15 DIAGNOSIS — Z0000 Encounter for general adult medical examination without abnormal findings: Secondary | ICD-10-CM | POA: Diagnosis not present

## 2020-08-15 DIAGNOSIS — J060 Acute laryngopharyngitis: Secondary | ICD-10-CM | POA: Diagnosis not present

## 2020-08-17 ENCOUNTER — Other Ambulatory Visit (HOSPITAL_COMMUNITY): Payer: Self-pay | Admitting: Internal Medicine

## 2020-08-17 DIAGNOSIS — Z0000 Encounter for general adult medical examination without abnormal findings: Secondary | ICD-10-CM | POA: Diagnosis not present

## 2020-08-17 DIAGNOSIS — M810 Age-related osteoporosis without current pathological fracture: Secondary | ICD-10-CM

## 2020-08-17 DIAGNOSIS — E782 Mixed hyperlipidemia: Secondary | ICD-10-CM | POA: Diagnosis not present

## 2020-08-17 DIAGNOSIS — Z23 Encounter for immunization: Secondary | ICD-10-CM | POA: Diagnosis not present

## 2020-09-06 ENCOUNTER — Ambulatory Visit (HOSPITAL_COMMUNITY)
Admission: RE | Admit: 2020-09-06 | Discharge: 2020-09-06 | Disposition: A | Discharge status: Home / Self Care | Payer: Medicare Other | Source: Ambulatory Visit | Attending: Internal Medicine | Admitting: Internal Medicine

## 2020-09-06 ENCOUNTER — Other Ambulatory Visit: Payer: Self-pay

## 2020-09-06 DIAGNOSIS — Z780 Asymptomatic menopausal state: Secondary | ICD-10-CM | POA: Diagnosis not present

## 2020-09-06 DIAGNOSIS — M810 Age-related osteoporosis without current pathological fracture: Secondary | ICD-10-CM | POA: Diagnosis not present

## 2020-09-09 DIAGNOSIS — M810 Age-related osteoporosis without current pathological fracture: Secondary | ICD-10-CM | POA: Diagnosis not present

## 2020-09-19 ENCOUNTER — Other Ambulatory Visit (HOSPITAL_COMMUNITY): Payer: Self-pay | Admitting: Internal Medicine

## 2020-09-19 DIAGNOSIS — Z1231 Encounter for screening mammogram for malignant neoplasm of breast: Secondary | ICD-10-CM

## 2020-09-26 ENCOUNTER — Other Ambulatory Visit: Payer: Self-pay

## 2020-09-26 ENCOUNTER — Ambulatory Visit (HOSPITAL_COMMUNITY)
Admission: RE | Admit: 2020-09-26 | Discharge: 2020-09-26 | Disposition: A | Discharge status: Home / Self Care | Payer: Medicare Other | Source: Ambulatory Visit | Attending: Internal Medicine | Admitting: Internal Medicine

## 2020-09-26 DIAGNOSIS — Z1231 Encounter for screening mammogram for malignant neoplasm of breast: Secondary | ICD-10-CM | POA: Insufficient documentation

## 2020-10-19 ENCOUNTER — Encounter (HOSPITAL_COMMUNITY)
Admission: RE | Admit: 2020-10-19 | Discharge: 2020-10-19 | Disposition: A | Discharge status: Home / Self Care | Payer: Medicare Other | Source: Ambulatory Visit | Attending: Internal Medicine | Admitting: Internal Medicine

## 2020-10-19 ENCOUNTER — Other Ambulatory Visit: Payer: Self-pay

## 2020-10-19 ENCOUNTER — Encounter (HOSPITAL_COMMUNITY): Payer: Self-pay

## 2020-10-19 DIAGNOSIS — M810 Age-related osteoporosis without current pathological fracture: Secondary | ICD-10-CM | POA: Diagnosis not present

## 2020-10-19 HISTORY — DX: Age-related osteoporosis without current pathological fracture: M81.0

## 2020-10-19 HISTORY — DX: Hyperlipidemia, unspecified: E78.5

## 2020-10-19 MED ORDER — DENOSUMAB 60 MG/ML ~~LOC~~ SOSY
60.0000 mg | PREFILLED_SYRINGE | Freq: Once | SUBCUTANEOUS | Status: AC
  Administered 2020-10-19: 60 mg via SUBCUTANEOUS

## 2020-10-19 NOTE — Discharge Instructions (Signed)
Denosumab injection What is this medicine? DENOSUMAB (den oh sue mab) slows bone breakdown. Prolia is used to treat osteoporosis in women after menopause and in men, and in people who are taking corticosteroids for 6 months or more. Xgeva is used to treat a high calcium level due to cancer and to prevent bone fractures and other bone problems caused by multiple myeloma or cancer bone metastases. Xgeva is also used to treat giant cell tumor of the bone. This medicine may be used for other purposes; ask your health care provider or pharmacist if you have questions. COMMON BRAND NAME(S): Prolia, XGEVA What should I tell my health care provider before I take this medicine? They need to know if you have any of these conditions:  dental disease  having surgery or tooth extraction  infection  kidney disease  low levels of calcium or Vitamin D in the blood  malnutrition  on hemodialysis  skin conditions or sensitivity  thyroid or parathyroid disease  an unusual reaction to denosumab, other medicines, foods, dyes, or preservatives  pregnant or trying to get pregnant  breast-feeding How should I use this medicine? This medicine is for injection under the skin. It is given by a health care professional in a hospital or clinic setting. A special MedGuide will be given to you before each treatment. Be sure to read this information carefully each time. For Prolia, talk to your pediatrician regarding the use of this medicine in children. Special care may be needed. For Xgeva, talk to your pediatrician regarding the use of this medicine in children. While this drug may be prescribed for children as young as 13 years for selected conditions, precautions do apply. Overdosage: If you think you have taken too much of this medicine contact a poison control center or emergency room at once. NOTE: This medicine is only for you. Do not share this medicine with others. What if I miss a dose? It is  important not to miss your dose. Call your doctor or health care professional if you are unable to keep an appointment. What may interact with this medicine? Do not take this medicine with any of the following medications:  other medicines containing denosumab This medicine may also interact with the following medications:  medicines that lower your chance of fighting infection  steroid medicines like prednisone or cortisone This list may not describe all possible interactions. Give your health care provider a list of all the medicines, herbs, non-prescription drugs, or dietary supplements you use. Also tell them if you smoke, drink alcohol, or use illegal drugs. Some items may interact with your medicine. What should I watch for while using this medicine? Visit your doctor or health care professional for regular checks on your progress. Your doctor or health care professional may order blood tests and other tests to see how you are doing. Call your doctor or health care professional for advice if you get a fever, chills or sore throat, or other symptoms of a cold or flu. Do not treat yourself. This drug may decrease your body's ability to fight infection. Try to avoid being around people who are sick. You should make sure you get enough calcium and vitamin D while you are taking this medicine, unless your doctor tells you not to. Discuss the foods you eat and the vitamins you take with your health care professional. See your dentist regularly. Brush and floss your teeth as directed. Before you have any dental work done, tell your dentist you are   receiving this medicine. Do not become pregnant while taking this medicine or for 5 months after stopping it. Talk with your doctor or health care professional about your birth control options while taking this medicine. Women should inform their doctor if they wish to become pregnant or think they might be pregnant. There is a potential for serious side  effects to an unborn child. Talk to your health care professional or pharmacist for more information. What side effects may I notice from receiving this medicine? Side effects that you should report to your doctor or health care professional as soon as possible:  allergic reactions like skin rash, itching or hives, swelling of the face, lips, or tongue  bone pain  breathing problems  dizziness  jaw pain, especially after dental work  redness, blistering, peeling of the skin  signs and symptoms of infection like fever or chills; cough; sore throat; pain or trouble passing urine  signs of low calcium like fast heartbeat, muscle cramps or muscle pain; pain, tingling, numbness in the hands or feet; seizures  unusual bleeding or bruising  unusually weak or tired Side effects that usually do not require medical attention (report to your doctor or health care professional if they continue or are bothersome):  constipation  diarrhea  headache  joint pain  loss of appetite  muscle pain  runny nose  tiredness  upset stomach This list may not describe all possible side effects. Call your doctor for medical advice about side effects. You may report side effects to FDA at 1-800-FDA-1088. Where should I keep my medicine? This medicine is only given in a clinic, doctor's office, or other health care setting and will not be stored at home. NOTE: This sheet is a summary. It may not cover all possible information. If you have questions about this medicine, talk to your doctor, pharmacist, or health care provider.  2020 Elsevier/Gold Standard (2018-04-25 16:10:44)

## 2021-02-16 DIAGNOSIS — J060 Acute laryngopharyngitis: Secondary | ICD-10-CM | POA: Diagnosis not present

## 2021-02-16 DIAGNOSIS — Z23 Encounter for immunization: Secondary | ICD-10-CM | POA: Diagnosis not present

## 2021-02-16 DIAGNOSIS — M810 Age-related osteoporosis without current pathological fracture: Secondary | ICD-10-CM | POA: Diagnosis not present

## 2021-02-16 DIAGNOSIS — Z712 Person consulting for explanation of examination or test findings: Secondary | ICD-10-CM | POA: Diagnosis not present

## 2021-02-16 DIAGNOSIS — Z0000 Encounter for general adult medical examination without abnormal findings: Secondary | ICD-10-CM | POA: Diagnosis not present

## 2021-02-16 DIAGNOSIS — Z6821 Body mass index (BMI) 21.0-21.9, adult: Secondary | ICD-10-CM | POA: Diagnosis not present

## 2021-02-16 DIAGNOSIS — E782 Mixed hyperlipidemia: Secondary | ICD-10-CM | POA: Diagnosis not present

## 2021-02-16 DIAGNOSIS — Z1331 Encounter for screening for depression: Secondary | ICD-10-CM | POA: Diagnosis not present

## 2021-02-21 DIAGNOSIS — L659 Nonscarring hair loss, unspecified: Secondary | ICD-10-CM | POA: Diagnosis not present

## 2021-02-21 DIAGNOSIS — E782 Mixed hyperlipidemia: Secondary | ICD-10-CM | POA: Diagnosis not present

## 2021-02-21 DIAGNOSIS — R002 Palpitations: Secondary | ICD-10-CM | POA: Diagnosis not present

## 2021-04-19 ENCOUNTER — Encounter (HOSPITAL_COMMUNITY)
Admission: RE | Admit: 2021-04-19 | Discharge: 2021-04-19 | Disposition: A | Discharge status: Home / Self Care | Payer: Medicare Other | Source: Ambulatory Visit | Attending: Internal Medicine | Admitting: Internal Medicine

## 2021-04-19 ENCOUNTER — Other Ambulatory Visit: Payer: Self-pay

## 2021-04-19 ENCOUNTER — Encounter (HOSPITAL_COMMUNITY): Payer: Self-pay

## 2021-04-19 DIAGNOSIS — M810 Age-related osteoporosis without current pathological fracture: Secondary | ICD-10-CM | POA: Diagnosis not present

## 2021-04-19 MED ORDER — DENOSUMAB 60 MG/ML ~~LOC~~ SOSY: 60.0000 mg | PREFILLED_SYRINGE | Freq: Once | SUBCUTANEOUS | Status: DC

## 2021-04-19 MED ORDER — DENOSUMAB 60 MG/ML ~~LOC~~ SOSY
PREFILLED_SYRINGE | SUBCUTANEOUS | Status: AC
  Administered 2021-04-19: 60 mg via SUBCUTANEOUS
  Filled 2021-04-19: qty 1

## 2021-06-27 DIAGNOSIS — H40003 Preglaucoma, unspecified, bilateral: Secondary | ICD-10-CM | POA: Diagnosis not present

## 2021-07-17 DIAGNOSIS — R002 Palpitations: Secondary | ICD-10-CM | POA: Diagnosis not present

## 2021-08-21 ENCOUNTER — Encounter: Payer: Self-pay | Admitting: Cardiology

## 2021-08-21 DIAGNOSIS — E782 Mixed hyperlipidemia: Secondary | ICD-10-CM | POA: Diagnosis not present

## 2021-09-11 DIAGNOSIS — Z23 Encounter for immunization: Secondary | ICD-10-CM | POA: Diagnosis not present

## 2021-09-11 DIAGNOSIS — R002 Palpitations: Secondary | ICD-10-CM | POA: Diagnosis not present

## 2021-09-11 DIAGNOSIS — E782 Mixed hyperlipidemia: Secondary | ICD-10-CM | POA: Diagnosis not present

## 2021-09-11 DIAGNOSIS — Z0001 Encounter for general adult medical examination with abnormal findings: Secondary | ICD-10-CM | POA: Diagnosis not present

## 2021-09-22 ENCOUNTER — Encounter: Payer: Self-pay | Admitting: *Deleted

## 2021-09-22 ENCOUNTER — Ambulatory Visit (INDEPENDENT_AMBULATORY_CARE_PROVIDER_SITE_OTHER): Payer: Medicare Other | Admitting: Cardiology

## 2021-09-22 ENCOUNTER — Encounter: Payer: Self-pay | Admitting: Cardiology

## 2021-09-22 VITALS — BP 116/74 | HR 73 | Ht 67.5 in | Wt 147.0 lb

## 2021-09-22 DIAGNOSIS — R002 Palpitations: Secondary | ICD-10-CM | POA: Diagnosis not present

## 2021-09-22 DIAGNOSIS — R29818 Other symptoms and signs involving the nervous system: Secondary | ICD-10-CM | POA: Diagnosis not present

## 2021-09-22 DIAGNOSIS — I480 Paroxysmal atrial fibrillation: Secondary | ICD-10-CM | POA: Diagnosis not present

## 2021-09-22 DIAGNOSIS — E782 Mixed hyperlipidemia: Secondary | ICD-10-CM

## 2021-09-22 NOTE — Progress Notes (Signed)
Cardiology Office Note  Date: 09/22/2021   ID: Teirra, Carapia [birth year], MRN [id number]  PCP:  Benita Stabile, MD  Cardiologist:  Nona Dell, MD Electrophysiologist:  None   Chief Complaint  Patient presents with   Palpitations     History of Present Illness: Gloria Stewart is a 70 y.o. female referred for cardiology consultation by Ms.Gloria Norman NP with Dr. Margo Aye for evaluation of palpitations.  She tells me that over the last several months, essentially since this past winter, she has been experiencing intermittent palpitations described as an irregular heartbeat.  This has mostly occurred when she is still in the evenings, sometimes wakes her up, much less often during the daytime.  Episodes can last for several hours. She is active, walks 5 miles each day.  She retired from the [employer] 20 years ago, lives alone in [place].  She bought an Scientist, physiological back in June, since that time has gotten atrial fibrillation alerts which we reviewed today and are consistent with paroxysmal atrial fibrillation.  CHA2DS2-VASc score is at least 2.  We did discuss stroke risk with atrial fibrillation and use of DOAC's.  We discussed Eliquis today.  Heart rate is regular today, her most recent ECG showed sinus rhythm.  She has no history of known OSA, has never been tested.  She is uncertain whether she snores or not since she lives alone.  Past Medical History:  Diagnosis Date   Hyperlipidemia    Osteoporosis    Preglaucoma     Past Surgical History:  Procedure Laterality Date   COLONOSCOPY N/A 02/09/2016   Procedure: COLONOSCOPY;  Surgeon: Corbin Ade, MD;  Location: [facility];  Service: Endoscopy;  Laterality: N/A;  900 - moved to 10:15 - office to notify   TONSILLECTOMY     at 70 years old    Current Outpatient Medications  Medication Sig Dispense Refill   atorvastatin (LIPITOR) 10 MG tablet Take 10 mg by mouth as directed. Monday and Friday only     calcium carbonate  (OS-CAL - DOSED IN MG OF ELEMENTAL CALCIUM) 1250 (500 Ca) MG tablet Take 1 tablet by mouth.     cholecalciferol (VITAMIN D) 1000 units tablet Take 1,000 Units by mouth daily.     denosumab (PROLIA) 60 MG/ML SOSY injection Inject 60 mg into the skin every 6 (six) months.     No current facility-administered medications for this visit.   Allergies:  Patient has no known allergies.   Social History: The patient  reports that she quit smoking about 40 years ago. Her smoking use included cigarettes. She has never used smokeless tobacco. She reports current alcohol use. She reports that she does not use drugs.   Family History: The patient's family history includes Cirrhosis in her father; Hodgkin's lymphoma in her mother.   ROS: No dizziness or syncope.  Physical Exam: VS:  BP 116/74   Pulse 73   Ht 5' 7.5" (1.715 m)   Wt 147 lb (66.7 kg)   SpO2 98%   BMI 22.68 kg/m , BMI Body mass index is 22.68 kg/m.  Wt Readings from Last 3 Encounters:  09/22/21 147 lb (66.7 kg)  04/19/21 150 lb (68 kg)  10/19/20 143 lb (64.9 kg)    General: Patient appears comfortable at rest. HEENT: Conjunctiva and lids normal, wearing a mask. Neck: Supple, no elevated JVP or carotid bruits, no thyromegaly. Lungs: Clear to auscultation, nonlabored breathing at rest. Cardiac: Regular rate and rhythm,  no S3 or significant systolic murmur, no pericardial rub. Abdomen: Soft, bowel sounds present. Extremities: No pitting edema, distal pulses 2+. Skin: Warm and dry. Musculoskeletal: No kyphosis. Neuropsychiatric: Alert and oriented x3, affect grossly appropriate.  ECG:  An ECG dated July 2022 was personally reviewed today and demonstrated:  Incomplete faxed tracing shows a sinus rhythm.  Recent Labwork:  No lab work available for review today.  Other Studies Reviewed Today:  No prior cardiac testing for review today.  Assessment and Plan:  1.  Paroxysmal atrial fibrillation based on review of telemetry  captured by her Apple Watch.  CHA2DS2-VASc score is at least 2 and we discussed stroke risk.  Plan is to obtain her recent lab work from Dr. Scharlene Gloss office and initiate Eliquis for stroke prophylaxis.  7-day Zio patch will also be placed to better assess rhythm frequency to determine whether we approach this with AV nodal blockers versus antiarrhythmic therapy.  Obtain echocardiogram for cardiac structural assessment.  Also refer to Dr. Craige Cotta for OSA evaluation.  2.  Mixed hyperlipidemia, currently on low-dose Lipitor.  Medication Adjustments/Labs and Tests Ordered: Current medicines are reviewed at length with the patient today.  Concerns regarding medicines are outlined above.   Tests Ordered: Orders Placed This Encounter  Procedures   Ambulatory referral to Pulmonology   ECHOCARDIOGRAM COMPLETE     Medication Changes: No orders of the defined types were placed in this encounter.   Disposition:  Follow up  6 weeks.  Signed, Jonelle Sidle, MD, [facility] 09/22/2021 2:05 PM    [facility] [address] [place], [place], [place] [address] Phone: [phone]; Fax: [phone]

## 2021-09-22 NOTE — Patient Instructions (Addendum)
Medication Instructions:  Your physician recommends that you continue on your current medications as directed. Please refer to the Current Medication list given to you today.  Labwork: none  Testing/Procedures: Your physician has recommended that you wear an 7 day event monitor. Event monitors are medical devices that record the heart's electrical activity. Doctors most often [place] these monitors to diagnose arrhythmias. Arrhythmias are problems with the speed or rhythm of the heartbeat. The monitor is a small, portable device. You can wear one while you do your normal daily activities. This is usually used to diagnose what is causing palpitations/syncope (passing out). Your physician has requested that you have an echocardiogram. Echocardiography is a painless test that uses sound waves to create images of your heart. It provides your doctor with information about the size and shape of your heart and how well your heart's chambers and valves are working. This procedure takes approximately one hour. There are no restrictions for this procedure.  Follow-Up: Your physician recommends that you schedule a follow-up appointment in: 6 weeks  Any Other Special Instructions Will Be Listed Below (If Applicable). You have been referred to Dr. Craige Cotta - Pulmonary   If you need a refill on your cardiac medications before your next appointment, please call your pharmacy.

## 2021-10-04 ENCOUNTER — Other Ambulatory Visit: Payer: Self-pay | Admitting: Cardiology

## 2021-10-04 ENCOUNTER — Telehealth: Payer: Self-pay | Admitting: Cardiology

## 2021-10-04 DIAGNOSIS — R002 Palpitations: Secondary | ICD-10-CM

## 2021-10-04 NOTE — Telephone Encounter (Signed)
Pt came into office stating that she has not heard anything about the monitor she's supposed to receive in the mail and her pharmacy never received the Rx for the Eliquis.    States she's been having more irregular heart beats since she last seen Dr. Diona Browner   Please contact cell phone @ [phone]

## 2021-10-04 NOTE — Telephone Encounter (Signed)
Contacted to advise that heart monitor wasn't ordered but will be ordered today-Will have patient come to office to have monitor placed. Also, to advise that we are awaiting lab work from PCP to determine appropriate dose for eliquis. Awaiting call back

## 2021-10-05 ENCOUNTER — Ambulatory Visit (INDEPENDENT_AMBULATORY_CARE_PROVIDER_SITE_OTHER): Payer: Medicare Other

## 2021-10-05 DIAGNOSIS — R002 Palpitations: Secondary | ICD-10-CM | POA: Diagnosis not present

## 2021-10-05 NOTE — Telephone Encounter (Signed)
Patient informed and verbalized understanding of plan. Will come to office today @1 :15 pm to have monitor placed

## 2021-10-06 ENCOUNTER — Telehealth: Payer: Self-pay | Admitting: *Deleted

## 2021-10-06 MED ORDER — APIXABAN 5 MG PO TABS: 5.0000 mg | ORAL_TABLET | Freq: Two times a day (BID) | ORAL | 0 refills | Status: DC

## 2021-10-06 MED ORDER — APIXABAN 5 MG PO TABS: 5.0000 mg | ORAL_TABLET | Freq: Two times a day (BID) | ORAL | 6 refills | Status: DC

## 2021-10-06 NOTE — Telephone Encounter (Signed)
Patient informed and verbalized understanding of plan. 

## 2021-10-06 NOTE — Telephone Encounter (Signed)
-----   Message from Jonelle Sidle, MD sent at 10/05/2021  3:47 PM EDT ----- Results reviewed.  Lab work reviewed from Dr. Scharlene Gloss office.  Renal function and hemoglobin are normal.  Would plan to start Eliquis at 5 mg twice daily.

## 2021-10-09 ENCOUNTER — Other Ambulatory Visit (HOSPITAL_COMMUNITY): Payer: Self-pay | Admitting: Internal Medicine

## 2021-10-09 DIAGNOSIS — Z1231 Encounter for screening mammogram for malignant neoplasm of breast: Secondary | ICD-10-CM

## 2021-10-16 ENCOUNTER — Other Ambulatory Visit: Payer: Self-pay

## 2021-10-16 ENCOUNTER — Ambulatory Visit (HOSPITAL_COMMUNITY)
Admission: RE | Admit: 2021-10-16 | Discharge: 2021-10-16 | Disposition: A | Discharge status: Home / Self Care | Payer: Medicare Other | Source: Ambulatory Visit | Attending: Cardiology | Admitting: Cardiology

## 2021-10-16 DIAGNOSIS — I480 Paroxysmal atrial fibrillation: Secondary | ICD-10-CM | POA: Diagnosis not present

## 2021-10-16 LAB — ECHOCARDIOGRAM COMPLETE
Area-P 1/2: 3.6 cm2
MV M vel: 5.09 m/s
MV Peak grad: 103.6 mmHg
Radius: 0.4 cm
S' Lateral: 2.6 cm

## 2021-10-16 NOTE — Progress Notes (Signed)
*  PRELIMINARY RESULTS* Echocardiogram 2D Echocardiogram has been performed.  Stacey Drain 10/16/2021, 3:52 PM

## 2021-10-17 ENCOUNTER — Telehealth: Payer: Self-pay | Admitting: Cardiology

## 2021-10-17 ENCOUNTER — Encounter: Payer: Self-pay | Admitting: *Deleted

## 2021-10-17 ENCOUNTER — Telehealth: Payer: Self-pay | Admitting: *Deleted

## 2021-10-17 DIAGNOSIS — R002 Palpitations: Secondary | ICD-10-CM

## 2021-10-17 DIAGNOSIS — I480 Paroxysmal atrial fibrillation: Secondary | ICD-10-CM

## 2021-10-17 MED ORDER — FLECAINIDE ACETATE 50 MG PO TABS: 50.0000 mg | ORAL_TABLET | Freq: Two times a day (BID) | ORAL | 1 refills | Status: DC

## 2021-10-17 MED ORDER — METOPROLOL SUCCINATE ER 25 MG PO TB24: 12.5000 mg | ORAL_TABLET | Freq: Every day | ORAL | 1 refills | Status: DC

## 2021-10-17 NOTE — Telephone Encounter (Signed)
Patient informed and verbalized understanding of plan. Copy sent to PCP 

## 2021-10-17 NOTE — Telephone Encounter (Signed)
-----   Message from Jonelle Sidle, MD sent at 10/17/2021 12:10 PM EDT ----- Results reviewed.  Cardiac monitor shows several episodes of paroxysmal atrial fibrillation, actually about a third of the time that she was wearing the monitor.  Given this I suspect that antiarrhythmic therapy would be most effective in helping her symptomatically by suppressing the arrhythmia.  She should already be on Eliquis, echocardiogram reviewed.  Suggest that we start flecainide 50 mg twice daily and Toprol-XL 12.5 mg daily.  She would need to have a GXT in about 2 weeks after starting and then please schedule office follow-up in the next 4 to 6 weeks.

## 2021-10-17 NOTE — Telephone Encounter (Signed)
Checking percert on the following patient for testing scheduled at [facility].     GXT  11-06-2021

## 2021-10-17 NOTE — Telephone Encounter (Signed)
-----   Message from Jonelle Sidle, MD sent at 10/16/2021  7:41 PM EDT ----- Results reviewed.  LVEF is normal at 55 to 60%.  Left atrium mildly dilated.  Also mild to moderate mitral regurgitation and tricuspid regurgitation.  Would follow-up on cardiac monitor and then determine next step in management.

## 2021-10-17 NOTE — Telephone Encounter (Signed)
Called patient to verify date and time of her GXT.  No answer.

## 2021-10-18 ENCOUNTER — Ambulatory Visit (HOSPITAL_COMMUNITY)
Admission: RE | Admit: 2021-10-18 | Discharge: 2021-10-18 | Disposition: A | Discharge status: Home / Self Care | Payer: Medicare Other | Source: Ambulatory Visit | Attending: Internal Medicine | Admitting: Internal Medicine

## 2021-10-18 ENCOUNTER — Other Ambulatory Visit: Payer: Self-pay

## 2021-10-18 DIAGNOSIS — Z1231 Encounter for screening mammogram for malignant neoplasm of breast: Secondary | ICD-10-CM | POA: Diagnosis not present

## 2021-10-19 ENCOUNTER — Encounter (HOSPITAL_COMMUNITY)
Admission: RE | Admit: 2021-10-19 | Discharge: 2021-10-19 | Disposition: A | Discharge status: Home / Self Care | Payer: Medicare Other | Source: Ambulatory Visit | Attending: Internal Medicine | Admitting: Internal Medicine

## 2021-10-19 DIAGNOSIS — M810 Age-related osteoporosis without current pathological fracture: Secondary | ICD-10-CM | POA: Diagnosis not present

## 2021-10-19 MED ORDER — DENOSUMAB 60 MG/ML ~~LOC~~ SOSY
PREFILLED_SYRINGE | SUBCUTANEOUS | Status: AC
  Filled 2021-10-19: qty 1

## 2021-10-19 MED ORDER — DENOSUMAB 60 MG/ML ~~LOC~~ SOSY
60.0000 mg | PREFILLED_SYRINGE | Freq: Once | SUBCUTANEOUS | Status: AC
  Administered 2021-10-19: 60 mg via SUBCUTANEOUS

## 2021-11-06 ENCOUNTER — Other Ambulatory Visit: Payer: Self-pay

## 2021-11-06 ENCOUNTER — Ambulatory Visit (HOSPITAL_COMMUNITY)
Admission: RE | Admit: 2021-11-06 | Discharge: 2021-11-06 | Disposition: A | Discharge status: Home / Self Care | Payer: Medicare Other | Source: Ambulatory Visit | Attending: Cardiology | Admitting: Cardiology

## 2021-11-06 DIAGNOSIS — I480 Paroxysmal atrial fibrillation: Secondary | ICD-10-CM

## 2021-11-06 DIAGNOSIS — R002 Palpitations: Secondary | ICD-10-CM | POA: Diagnosis not present

## 2021-11-06 LAB — EXERCISE TOLERANCE TEST
Angina Index: 0
Duke Treadmill Score: 12
Estimated workload: 10.3
Exercise duration (min): 11 min
Exercise duration (sec): 54 s
MPHR: 149 {beats}/min
Peak HR: 126 {beats}/min
Percent HR: 84 %
RPE: 17
Rest HR: 55 {beats}/min
ST Depression (mm): 0 mm

## 2021-11-15 ENCOUNTER — Ambulatory Visit (INDEPENDENT_AMBULATORY_CARE_PROVIDER_SITE_OTHER): Payer: Medicare Other | Admitting: Pulmonary Disease

## 2021-11-15 ENCOUNTER — Other Ambulatory Visit: Payer: Self-pay

## 2021-11-15 ENCOUNTER — Encounter: Payer: Self-pay | Admitting: Pulmonary Disease

## 2021-11-15 VITALS — BP 136/78 | HR 54 | Temp 98.4°F | Ht 67.0 in | Wt 143.1 lb

## 2021-11-15 DIAGNOSIS — R0683 Snoring: Secondary | ICD-10-CM | POA: Diagnosis not present

## 2021-11-15 NOTE — Patient Instructions (Signed)
Will arrange for home sleep study Will call to arrange for follow up after sleep study reviewed  

## 2021-11-15 NOTE — Progress Notes (Signed)
Gloria Stewart, Gloria Stewart, Gloria Stewart  Chief Complaint  Patient presents with   Consult    Sleep consult ref by Dr. Diona Browner for sleep apnea consult. Patient has afib Gloria it is changing per patient. Dr. Diona Browner wanted to rule out sleep apnea.     Past Surgical History:  She  has a past surgical history that includes Tonsillectomy Gloria Colonoscopy (N/A, 02/09/2016).  Past Medical History:  HLD, Osteoporosis, PAF, Valvular heart disease  Constitutional:  BP 136/78   Pulse (!) 54   Temp 98.4 F (36.9 C) (Temporal)   Ht 5\' 7"  (1.702 m)   Wt 143 lb 1.3 oz (64.9 kg)   SpO2 100%   BMI 22.41 kg/m   Brief Summary:  Gloria Stewart is a 70 y.o. female former smoker with snoring.      Subjective:   She is followed by cardiology for atrial fibrillation.  There was concern she could has sleep apnea contributing to this.  Her symptoms started when she noticed palpitations when laying down to sleep.  She snores intermittently.  She has trouble staying awake while watching TV.  She goes to sleep between 9 Gloria 10 pm.  She falls asleep in 5 to 10 minutes.  She wakes up 2 or 3 times to use the bathroom.  She gets out of bed at 530 am.  She feels good in the morning.  She denies morning headache.  She does not use anything to help her fall sleep or stay awake.  She denies sleep walking, sleep talking, bruxism, or nightmares.  There is no history of restless legs.  She denies sleep hallucinations, sleep paralysis, or cataplexy.  The Epworth score is 2 out of 24.   Physical Exam:   Appearance - well kempt   ENMT - no sinus tenderness, no oral exudate, no LAN, Mallampati 3 airway, no stridor  Respiratory - equal breath sounds bilaterally, no wheezing or rales  CV - s1s2 regular rate Gloria rhythm, no murmurs  Ext - no clubbing, no edema  Skin - no rashes  Psych - normal mood Gloria affect   Sleep Tests:    Cardiac Tests:  Echo 10/16/21 >> EF 55 to 60%, RVSP 33.2  mmHg, mild LA dilation, mild/mod MR, mild/mod TR, mild AR  Social History:  She  reports that she quit smoking about 40 years ago. Her smoking use included cigarettes. She has never used smokeless tobacco. She reports current alcohol use. She reports that she does not use drugs.  Family History:  Her family history includes Cirrhosis in her father; Hodgkin's lymphoma in her mother.    Discussion:  She has snoring, sleep disruption, Gloria daytime sleepiness.  She has history of atrial fibrillation.  It is possible she could have obstructive sleep apnea contributing to these.  Assessment/Plan:   Snoring with excessive daytime sleepiness. - will need to arrange for a home sleep study  Cardiovascular risk. - had an extensive discussion regarding the adverse health consequences related to untreated sleep disordered breathing - specifically discussed the risks for hypertension, coronary artery disease, cardiac dysrhythmias, cerebrovascular disease, Gloria diabetes - lifestyle modification discussed  Safe driving practices. - discussed how sleep disruption can increase risk of accidents, particularly when driving - safe driving practices were discussed  Therapies for obstructive sleep apnea. - if the sleep study shows significant sleep apnea, then various therapies for treatment were reviewed: CPAP, oral appliance, Gloria surgical interventions  Time Spent Involved in Patient Stewart on  Day of Examination:  32 minutes  Follow up:   Patient Instructions  Will arrange for home sleep study Will call to arrange for follow up after sleep study reviewed  Medication List:   Allergies as of 11/15/2021   No Known Allergies      Medication List        Accurate as of November 15, 2021 10:23 AM. If you have any questions, ask your nurse or doctor.          apixaban 5 MG Tabs tablet Commonly known as: Eliquis Take 1 tablet (5 mg total) by mouth 2 (two) times daily.   atorvastatin 10 MG  tablet Commonly known as: LIPITOR Take 10 mg by mouth as directed. Monday Gloria Friday only   calcium carbonate 1250 (500 Ca) MG tablet Commonly known as: OS-CAL - dosed in mg of elemental calcium Take 1 tablet by mouth.   cholecalciferol 1000 units tablet Commonly known as: VITAMIN D Take 1,000 Units by mouth daily.   denosumab 60 MG/ML Sosy injection Commonly known as: PROLIA Inject 60 mg into the skin every 6 (six) months.   flecainide 50 MG tablet Commonly known as: TAMBOCOR Take 1 tablet (50 mg total) by mouth 2 (two) times daily.   metoprolol succinate 25 MG 24 hr tablet Commonly known as: Toprol XL Take 0.5 tablets (12.5 mg total) by mouth daily.        Signature:  Gloria Helling, MD [facility] Stewart/Gloria Stewart Pager - [phone] 11/15/2021, 10:23 AM

## 2021-11-20 NOTE — Progress Notes (Signed)
Cardiology Office Note    Date:  11/21/2021   ID:  [name], [name] [birth year], MRN [id number]  PCP:  Celene Squibb, MD  Cardiologist: Rozann Lesches, MD    Chief Complaint  Patient presents with   Follow-up    2 month visit    History of Present Illness:    Gloria Stewart is a 70 y.o. female with past medical history of paroxysmal atrial fibrillation, HLD and osteoporosis who presents to the office today for 7-month follow-up.   She was last examined by Dr. Domenic Polite in 08/2021 as a new patient referral for palpitations and recent iWatch readings had confirmed atrial fibrillation. She was started on Eliquis for anticoagulation and an event monitor and echocardiogram were recommended for further evaluation. Was also referred to Dr. Halford Chessman for evaluation of OSA. Her echocardiogram showed a preserved EF of 55-60% with no regional WMA. She did have mild to moderate MR and mild to moderate TR. Her event monitor showed a 33% atrial fibrillation burden and she was started on Flecainide 50mg  BID and Toprol-XL 12.5mg  daily. GXT was ordered for monitoring of Flecainide therapy and showed no significant arrhythmias or QRS widening.   In talking with the patient today, she reports still having frequent palpitations and has not noticed any improvement in symptoms since being started on Flecainide and Toprol-XL. Reports palpitations are typically more notable in the afternoon but she can have them in the morning or evening hours. She has been doing EKG's on her iWatch and has documented episodes of atrial fibrillation. She does have occasional PAC's and was noted to have one on her EKG today and was symptomatic with this as well.  She reports good compliance with Eliquis and denies any evidence of active bleeding.   Past Medical History:  Diagnosis Date   Hyperlipidemia    Osteoporosis    Paroxysmal atrial fibrillation ([place])    Preglaucoma    Valvular heart disease     Past Surgical  History:  Procedure Laterality Date   COLONOSCOPY N/A 02/09/2016   Procedure: COLONOSCOPY;  Surgeon: Daneil Dolin, MD;  [facility];  Service: Endoscopy;  Laterality: N/A;  900 - moved to 10:15 - office to notify   TONSILLECTOMY     at 70 years old    Current Medications: Outpatient Medications Prior to Visit  Medication Sig Dispense Refill   apixaban (ELIQUIS) 5 MG TABS tablet Take 1 tablet (5 mg total) by mouth 2 (two) times daily. 60 tablet 6   atorvastatin (LIPITOR) 10 MG tablet Take 10 mg by mouth as directed. Monday and Friday only     calcium carbonate (OS-CAL - DOSED IN MG OF ELEMENTAL CALCIUM) 1250 (500 Ca) MG tablet Take 1 tablet by mouth.     cholecalciferol (VITAMIN D) 1000 units tablet Take 1,000 Units by mouth daily.     denosumab (PROLIA) 60 MG/ML SOSY injection Inject 60 mg into the skin every 6 (six) months.     metoprolol succinate (TOPROL XL) 25 MG 24 hr tablet Take 0.5 tablets (12.5 mg total) by mouth daily. 45 tablet 1   flecainide (TAMBOCOR) 50 MG tablet Take 1 tablet (50 mg total) by mouth 2 (two) times daily. 180 tablet 1   No facility-administered medications prior to visit.     Allergies:   Patient has no known allergies.   Social History   Socioeconomic History   Marital status: Single    Spouse name: Not on file  Number of children: Not on file   Years of education: Not on file   Highest education level: Not on file  Occupational History   Not on file  Tobacco Use   Smoking status: Former    Types: Cigarettes    Quit date: 01/16/1981    Years since quitting: 40.8   Smokeless tobacco: Never  Vaping Use   Vaping Use: Never used  Substance and Sexual Activity   Alcohol use: Yes   Drug use: No   Sexual activity: Not on file  Other Topics Concern   Not on file  Social History Narrative   Not on file   Social Determinants of Health   Financial Resource Strain: Not on file  Food Insecurity: Not on file  Transportation Needs: Not  on file  Physical Activity: Not on file  Stress: Not on file  Social Connections: Not on file     Family History:  The patient's family history includes Cirrhosis in her father; Hodgkin's lymphoma in her mother.   Review of Systems:    Please see the history of present illness.     All other systems reviewed and are otherwise negative except as noted above.   Physical Exam:    VS:  BP 122/78   Pulse (!) 56   Ht 5\' 7"  (1.702 m)   Wt 144 lb (65.3 kg)   SpO2 99%   BMI 22.55 kg/m    General: Well developed, well nourished,female appearing in no acute distress. Head: Normocephalic, atraumatic. Neck: No carotid bruits. JVD not elevated.  Lungs: Respirations regular and unlabored, without wheezes or rales.  Heart: Regular rate and rhythm. No S3 or S4.  No murmur, no rubs, or gallops appreciated. Abdomen: Appears non-distended. No obvious abdominal masses. Msk:  Strength and tone appear normal for age. No obvious joint deformities or effusions. Extremities: No clubbing or cyanosis. No pitting edema.  Distal pedal pulses are 2+ bilaterally. Neuro: Alert and oriented X 3. Moves all extremities spontaneously. No focal deficits noted. Psych:  Responds to questions appropriately with a normal affect. Skin: No rashes or lesions noted  Wt Readings from Last 3 Encounters:  11/21/21 144 lb (65.3 kg)  11/15/21 143 lb 1.3 oz (64.9 kg)  09/22/21 147 lb (66.7 kg)     Studies/Labs Reviewed:   EKG:  EKG is ordered today.  The ekg ordered today demonstrates sinus bradycardia, heart rate 54 with PACs.  Narrow QRS complex with QTc at 441 ms.  Recent Labs: No results found for requested labs within last 8760 hours.   Lipid Panel No results found for: CHOL, TRIG, HDL, CHOLHDL, VLDL, LDLCALC, LDLDIRECT  Additional studies/ records that were reviewed today include:   Echocardiogram: 09/2021 IMPRESSIONS     1. Left ventricular ejection fraction, by estimation, is 55 to 60%. The  left  ventricle has normal function. The left ventricle has no regional  wall motion abnormalities. Left ventricular diastolic parameters were  normal.   2. Right ventricular systolic function is normal. The right ventricular  size is normal. There is normal pulmonary artery systolic pressure. The  estimated right ventricular systolic pressure is 33.2 mmHg.   3. Left atrial size was mildly dilated.   4. The mitral valve is grossly normal. Mild to moderate mitral valve  regurgitation. No evidence of mitral stenosis.   5. Tricuspid valve regurgitation is mild to moderate.   6. The aortic valve is tricuspid. Aortic valve regurgitation is mild. No  aortic stenosis is present.  7. The inferior vena cava is dilated in size with >50% respiratory  variability, suggesting right atrial pressure of 8 mmHg.   Event Monitor: 09/2021 ZIO XT reviewed.  6 days, 23 hours analyzed.  Predominant rhythm is sinus with heart rate ranging from 45 bpm up to 123 bpm and average heart rate 68 bpm.  There were occasional PACs representing 2% total beats with otherwise rare atrial couplets and triplets.  There were rare PVCs including couplets representing less than 1% total beats.  There were several episodes of paroxysmal atrial fibrillation with RVR, overall 33% total rhythm burden with longest lasting event being 15 hours and 20 minutes with average heart rate in the 120s.  There were also 2 brief episodes of NSVT, the longest of which was 7 beats.  ETT: 10/2021   No ST deviation was noted.   Prior study not available for comparison.   IMPRESSIONS Negative for stress induced arrhythmias. Estimated exercise workload is 10, which confers a survival benefit.   RECOMMENDATIONS/CONCLUSIONS Stress ECG is negative at heart rate achieved (84% predicted).  Assessment:    1. PAF (paroxysmal atrial fibrillation) ([place])   2. Current use of long term anticoagulation   3. Mixed hyperlipidemia   4. Mitral valve insufficiency,  unspecified etiology      Plan:   In order of problems listed above:  1. Paroxysmal Atrial Fibrillation/ Use of Long-Term Anticoagulation - Recent event monitor showed a 33% atrial fibrillation burden and she was started on Toprol-XL 12.5 mg daily and Flecainide 50 mg twice daily but continues to experience palpitations. While she does have PAC's on examination today, she continues to have atrial fibrillation as well by review of her iWatch strips and reports symptoms feel different when she is in atrial fibrillation. I did review with Dr. Lovena Le who was here in clinic today and he recommended that we titrate Flecainide to 75 mg twice daily and arrange for a repeat EKG in 2 weeks. If symptoms do not improve, would refer to EP in [facility] for consideration of ablation. I did not further titrate her beta-blocker today given heart rate in the 50's. A sleep study is also being arranged by Pulmonology to rule-out OSA.  - No reports of active bleeding. Continue Eliquis 5 mg twice daily for anticoagulation which is the appropriate dose at this time based off her age, weight and kidney function.  2. HLD - Followed by her PCP.  She remains on Atorvastatin 10 mg twice weekly.   3. Mitral Regurgitation - Recent echocardiogram showed mild to moderate MR and TR. She does not have any prior echocardiograms available for comparison. Will continue to follow.   Medication Adjustments/Labs and Tests Ordered: Current medicines are reviewed at length with the patient today.  Concerns regarding medicines are outlined above.  Medication changes, Labs and Tests ordered today are listed in the Patient Instructions below. Patient Instructions  Medication Instructions:  INCREASE Flecainide to 75 mg twice a day    Labwork: None today   Testing/Procedures: Nurse appointment in 2 weeks for EKG   Follow-Up: 3-4 months with Dr.McDowell  Any Other Special Instructions Will Be Listed Below (If  Applicable).  If you need a refill on your cardiac medications before your next appointment, please call your pharmacy.   Signed, Erma Heritage, PA-C  11/21/2021 5:36 PM    [place] [address] [place], [place] [address] Phone: [phone] Fax: [phone]

## 2021-11-21 ENCOUNTER — Encounter: Payer: Self-pay | Admitting: Student

## 2021-11-21 ENCOUNTER — Ambulatory Visit (INDEPENDENT_AMBULATORY_CARE_PROVIDER_SITE_OTHER): Payer: Medicare Other | Admitting: Student

## 2021-11-21 ENCOUNTER — Other Ambulatory Visit: Payer: Self-pay

## 2021-11-21 VITALS — BP 122/78 | HR 56 | Ht 67.0 in | Wt 144.0 lb

## 2021-11-21 DIAGNOSIS — E782 Mixed hyperlipidemia: Secondary | ICD-10-CM | POA: Diagnosis not present

## 2021-11-21 DIAGNOSIS — I340 Nonrheumatic mitral (valve) insufficiency: Secondary | ICD-10-CM

## 2021-11-21 DIAGNOSIS — I480 Paroxysmal atrial fibrillation: Secondary | ICD-10-CM

## 2021-11-21 DIAGNOSIS — Z7901 Long term (current) use of anticoagulants: Secondary | ICD-10-CM | POA: Diagnosis not present

## 2021-11-21 MED ORDER — FLECAINIDE ACETATE 50 MG PO TABS: 75.0000 mg | ORAL_TABLET | Freq: Two times a day (BID) | ORAL | 3 refills | Status: DC

## 2021-11-21 NOTE — Patient Instructions (Signed)
Medication Instructions:  INCREASE Flecainide to 75 mg twice a day    Labwork: None today   Testing/Procedures: Nurse appointment in 2 weeks for EKG   Follow-Up: 3-4 months with Dr.McDowell  Any Other Special Instructions Will Be Listed Below (If Applicable).  If you need a refill on your cardiac medications before your next appointment, please call your pharmacy.

## 2021-11-22 ENCOUNTER — Ambulatory Visit: Payer: Medicare Other | Admitting: Cardiology

## 2021-12-05 ENCOUNTER — Other Ambulatory Visit: Payer: Self-pay

## 2021-12-05 ENCOUNTER — Ambulatory Visit (INDEPENDENT_AMBULATORY_CARE_PROVIDER_SITE_OTHER): Payer: Medicare Other

## 2021-12-05 DIAGNOSIS — Z5181 Encounter for therapeutic drug level monitoring: Secondary | ICD-10-CM

## 2021-12-05 DIAGNOSIS — Z79899 Other long term (current) drug therapy: Secondary | ICD-10-CM

## 2021-12-05 NOTE — Patient Instructions (Signed)
Medication Instructions:  Your physician recommends that you continue on your current medications as directed. Please refer to the Current Medication list given to you today.  *If you need a refill on your cardiac medications before your next appointment, please call your pharmacy*   Lab Work: None If you have labs (blood work) drawn today and your tests are completely normal, you will receive your results only by: MyChart Message (if you have MyChart) OR A paper copy in the mail If you have any lab test that is abnormal or we need to change your treatment, we will call you to review the results.   Testing/Procedures: None

## 2021-12-05 NOTE — Progress Notes (Signed)
Per Randall An, PA-C:   No medication changes today

## 2021-12-05 NOTE — Progress Notes (Signed)
Pt presents today for nurse visit- EKG d/t monitoring flecainide therapy and adjustment.  Pt states that she feels "perfect" since starting medication.

## 2022-03-01 ENCOUNTER — Encounter: Payer: Self-pay | Admitting: Pulmonary Disease

## 2022-03-06 ENCOUNTER — Other Ambulatory Visit: Payer: Self-pay

## 2022-03-06 ENCOUNTER — Encounter: Payer: Self-pay | Admitting: Cardiology

## 2022-03-06 ENCOUNTER — Ambulatory Visit (INDEPENDENT_AMBULATORY_CARE_PROVIDER_SITE_OTHER): Payer: Medicare Other | Admitting: Cardiology

## 2022-03-06 VITALS — BP 92/64 | HR 54 | Ht 68.0 in | Wt 140.8 lb

## 2022-03-06 DIAGNOSIS — I480 Paroxysmal atrial fibrillation: Secondary | ICD-10-CM | POA: Diagnosis not present

## 2022-03-06 DIAGNOSIS — I340 Nonrheumatic mitral (valve) insufficiency: Secondary | ICD-10-CM

## 2022-03-06 NOTE — Progress Notes (Signed)
? ? ?Cardiology Office Note ? ?Date: 03/06/2022  ? ?ID: Gloria Stewart, DOB 26-Jul-1951, MRN [id number] ? ?PCP:  Celene Squibb, MD  ?Cardiologist:  Rozann Lesches, MD ?Electrophysiologist:  None  ? ?Chief Complaint  ?Patient presents with  ? Cardiac follow-up  ? ? ?History of Present Illness: ?Gloria Stewart is a 71 y.o. female last seen in November 2022 by Ms. Strader PA-C.  She is here for a follow-up visit.  States that she has been doing very well in terms of atrial fibrillation control on current regimen as noted below.  She does not describe any spontaneous bleeding problems on Eliquis and will have follow-up lab work with Dr. Nevada Crane in April.  Tambocor has been very effective for her at current dose. ? ?Past Medical History:  ?Diagnosis Date  ? Hyperlipidemia   ? Osteoporosis   ? Paroxysmal atrial fibrillation ([facility])   ? Preglaucoma   ? Valvular heart disease   ? ? ?Past Surgical History:  ?Procedure Laterality Date  ? COLONOSCOPY N/A 02/09/2016  ? Procedure: COLONOSCOPY;  Surgeon: Daneil Dolin, MD;  [facility];  Service: Endoscopy;  Laterality: N/A;  900 - moved to 10:15 - office to notify  ? TONSILLECTOMY    ? at 71 years old  ? ? ?Current Outpatient Medications  ?Medication Sig Dispense Refill  ? apixaban (ELIQUIS) 5 MG TABS tablet Take 1 tablet (5 mg total) by mouth 2 (two) times daily. 60 tablet 6  ? atorvastatin (LIPITOR) 10 MG tablet Take 10 mg by mouth as directed. Monday and Friday only    ? calcium carbonate (OS-CAL - DOSED IN MG OF ELEMENTAL CALCIUM) 1250 (500 Ca) MG tablet Take 1 tablet by mouth.    ? cholecalciferol (VITAMIN D) 1000 units tablet Take 1,000 Units by mouth daily.    ? denosumab (PROLIA) 60 MG/ML SOSY injection Inject 60 mg into the skin every 6 (six) months.    ? flecainide (TAMBOCOR) 50 MG tablet Take 1.5 tablets (75 mg total) by mouth 2 (two) times daily. 270 tablet 3  ? metoprolol succinate (TOPROL XL) 25 MG 24 hr tablet Take 0.5 tablets (12.5 mg total) by mouth  daily. 45 tablet 1  ? ?No current facility-administered medications for this visit.  ? ?Allergies:  Patient has no known allergies.  ? ?ROS: No palpitations or chest pain.  No syncope. ? ?Physical Exam: ?VS:  BP 92/64   Pulse (!) 54   Ht 5\' 8"  (1.727 m)   Wt 140 lb 12.8 oz (63.9 kg)   SpO2 98%   BMI 21.41 kg/m? , BMI Body mass index is 21.41 kg/m?. ? ?Wt Readings from Last 3 Encounters:  ?03/06/22 140 lb 12.8 oz (63.9 kg)  ?11/21/21 144 lb (65.3 kg)  ?11/15/21 143 lb 1.3 oz (64.9 kg)  ?  ?General: Patient appears comfortable at rest. ?HEENT: Conjunctiva and lids normal, wearing a mask. ?Neck: Supple, no elevated JVP or carotid bruits, no thyromegaly. ?Lungs: Clear to auscultation, nonlabored breathing at rest. ?Cardiac: Regular rate and rhythm, no S3 or significant systolic murmur, no pericardial rub. ?Extremities: No pitting edema. ? ?ECG:  An ECG dated 12/05/2021 was personally reviewed today and demonstrated:  Sinus bradycardia with rightward axis. ? ?Recent Labwork: ? ?August 2022: Cholesterol 186, triglycerides 59, HDL 62, LDL 113, BUN 25, creatinine 0.76, potassium 4.4, AST 18, ALT 13, hemoglobin 13.0, platelets 212 ? ?Other Studies Reviewed Today: ? ?Echocardiogram 10/16/2021: ? 1. Left ventricular ejection fraction, by estimation,  is 55 to 60%. The  ?left ventricle has normal function. The left ventricle has no regional  ?wall motion abnormalities. Left ventricular diastolic parameters were  ?normal.  ? 2. Right ventricular systolic function is normal. The right ventricular  ?size is normal. There is normal pulmonary artery systolic pressure. The  ?estimated right ventricular systolic pressure is [id number] mmHg.  ? 3. Left atrial size was mildly dilated.  ? 4. The mitral valve is grossly normal. Mild to moderate mitral valve  ?regurgitation. No evidence of mitral stenosis.  ? 5. Tricuspid valve regurgitation is mild to moderate.  ? 6. The aortic valve is tricuspid. Aortic valve regurgitation is mild. No   ?aortic stenosis is present.  ? 7. The inferior vena cava is dilated in size with >50% respiratory  ?variability, suggesting right atrial pressure of 8 mmHg.  ? ?Cardiac monitor October 2022: ?ZIO XT reviewed.  6 days, 23 hours analyzed.  Predominant rhythm is sinus with heart rate ranging from 45 bpm up to 123 bpm and average heart rate 68 bpm.  There were occasional PACs representing 2% total beats with otherwise rare atrial couplets and triplets.  There were rare PVCs including couplets representing less than 1% total beats.  There were several episodes of paroxysmal atrial fibrillation with RVR, overall 33% total rhythm burden with longest lasting event being 15 hours and 20 minutes with average heart rate in the 120s.  There were also 2 brief episodes of NSVT, the longest of which was 7 beats. ? ?GXT 11/06/2021: ?  No ST deviation was noted. ?  Prior study not available for comparison. ?  ?IMPRESSIONS ?Negative for stress induced arrhythmias. ?Estimated exercise workload is 10, which confers a survival benefit. ?  ?RECOMMENDATIONS/CONCLUSIONS ?Stress ECG is negative at heart rate achieved (84% predicted). ? ?Assessment and Plan: ? ?1.  Paroxysmal atrial fibrillation, well controlled on current regimen including Toprol-XL and flecainide.  She is also on Eliquis for stroke prophylaxis, no reported bleeding problems.  Follow-up for lab work with Dr. [place] Crane in April as scheduled. ? ?2.  Mild to moderate mitral and tricuspid regurgitation, continue with observation for now. ? ?Medication Adjustments/Labs and Tests Ordered: ?Current medicines are reviewed at length with the patient today.  Concerns regarding medicines are outlined above.  ? ?Tests Ordered: ?No orders of the defined types were placed in this encounter. ? ? ?Medication Changes: ?No orders of the defined types were placed in this encounter. ? ? ?Disposition:  Follow up  6 months. ? ?Signed, ?Satira Sark, MD, [facility] ?03/06/2022 4:05 PM    ?[place] [facility] ?[address], [place], [place] [address] ?Phone: [phone]; Fax: [phone]  ?

## 2022-03-06 NOTE — Patient Instructions (Signed)
Medication Instructions:  Your physician recommends that you continue on your current medications as directed. Please refer to the Current Medication list given to you today.   Labwork: None today  Testing/Procedures: None today  Follow-Up: 6 months  Any Other Special Instructions Will Be Listed Below (If Applicable).  If you need a refill on your cardiac medications before your next appointment, please call your pharmacy.  

## 2022-03-08 ENCOUNTER — Other Ambulatory Visit: Payer: Self-pay

## 2022-03-08 ENCOUNTER — Ambulatory Visit: Payer: Medicare Other

## 2022-03-08 DIAGNOSIS — G4733 Obstructive sleep apnea (adult) (pediatric): Secondary | ICD-10-CM | POA: Diagnosis not present

## 2022-03-08 DIAGNOSIS — R0683 Snoring: Secondary | ICD-10-CM

## 2022-03-15 ENCOUNTER — Telehealth: Payer: Self-pay | Admitting: Pulmonary Disease

## 2022-03-15 DIAGNOSIS — G4733 Obstructive sleep apnea (adult) (pediatric): Secondary | ICD-10-CM | POA: Diagnosis not present

## 2022-03-15 NOTE — Telephone Encounter (Signed)
HST 03/08/22 >> AHI 14.2, SpO2 low 82% ? ?Please inform her that her sleep study shows mild obstructive sleep apnea.  Please arrange for ROV with me or NP to discuss treatment options. ? ? ? ?

## 2022-03-15 NOTE — Telephone Encounter (Signed)
ATC patient.  LMTCB. 

## 2022-03-16 NOTE — Telephone Encounter (Signed)
ATC patient. LMTCB with RDS number.  ?

## 2022-03-19 ENCOUNTER — Telehealth: Payer: Self-pay | Admitting: Pulmonary Disease

## 2022-03-19 NOTE — Telephone Encounter (Signed)
ATC patient.  ?Per protocol closing encounter after 3 attempts and sent patient a letter asking her to contact our office for results.  ? ?

## 2022-03-21 NOTE — Telephone Encounter (Signed)
Patient would like results of home sleep test. Patient phone number is [phone]. ?

## 2022-03-22 NOTE — Telephone Encounter (Signed)
Called and went over results with patient. She voiced understanding. Made an appt for [place] office with TP first of April. Appt made and nothing further needed.  ?

## 2022-04-02 ENCOUNTER — Ambulatory Visit: Payer: Medicare Other | Admitting: Adult Health

## 2022-04-02 ENCOUNTER — Other Ambulatory Visit: Payer: Self-pay | Admitting: Cardiology

## 2022-04-04 DIAGNOSIS — E782 Mixed hyperlipidemia: Secondary | ICD-10-CM | POA: Diagnosis not present

## 2022-04-05 DIAGNOSIS — Z08 Encounter for follow-up examination after completed treatment for malignant neoplasm: Secondary | ICD-10-CM | POA: Diagnosis not present

## 2022-04-05 DIAGNOSIS — Z1283 Encounter for screening for malignant neoplasm of skin: Secondary | ICD-10-CM | POA: Diagnosis not present

## 2022-04-05 DIAGNOSIS — Z85828 Personal history of other malignant neoplasm of skin: Secondary | ICD-10-CM | POA: Diagnosis not present

## 2022-04-05 DIAGNOSIS — I781 Nevus, non-neoplastic: Secondary | ICD-10-CM | POA: Diagnosis not present

## 2022-04-11 ENCOUNTER — Encounter: Payer: Self-pay | Admitting: Primary Care

## 2022-04-11 ENCOUNTER — Ambulatory Visit (INDEPENDENT_AMBULATORY_CARE_PROVIDER_SITE_OTHER): Payer: Medicare Other | Admitting: Primary Care

## 2022-04-11 VITALS — BP 112/70 | HR 55 | Temp 99.5°F | Ht 68.0 in | Wt 137.8 lb

## 2022-04-11 DIAGNOSIS — G4730 Sleep apnea, unspecified: Secondary | ICD-10-CM

## 2022-04-11 NOTE — Patient Instructions (Signed)
Home sleep study showed evidence of mild to moderate obstructive sleep apnea, you had on average 14.2 apneic/hypopneic events an hour ? ?Treatment options include weight management, side sleeping position, oral appliance, CPAP therapy or referral to ENT for possible surgical options ? ?At this time we have elected to conservatively manage with positional sleep therapy and referral to orthodontics for consideration for oral appliance ? ?Do not drive if experiencing excessive daytime sleepiness or somnolence ? ?If sleep becomes more restless, snoring worsenings or A-fib is not controlled please return to see [place] sooner ? ?Referral: ?-Orthodontics re: mild sleep apnea ?(Dr. Augustina [phone]) ? ?Follow-up ?- 6 months with Eustaquio Maize NP  ? ?Sleep Apnea ?Sleep apnea affects breathing during sleep. It causes breathing to stop for 10 seconds or more, or to become shallow. People with sleep apnea usually snore loudly. ?It can also increase the risk of: ?Heart attack. ?Stroke. ?Being very overweight (obese). ?Diabetes. ?Heart failure. ?Irregular heartbeat. ?High blood pressure. ?The goal of treatment is to help you breathe normally again. ?What are the causes? ?The most common cause of this condition is a collapsed or blocked airway. ?There are three kinds of sleep apnea: ?Obstructive sleep apnea. This is caused by a blocked or collapsed airway. ?Central sleep apnea. This happens when the brain does not send the right signals to the muscles that control breathing. ?Mixed sleep apnea. This is a combination of obstructive and central sleep apnea. ?What increases the risk? ?Being overweight. ?Smoking. ?Having a small airway. ?Being older. ?Being female. ?Drinking alcohol. ?Taking medicines to calm yourself (sedatives or tranquilizers). ?Having family members with the condition. ?Having a tongue or tonsils that are larger than normal. ?What are the signs or symptoms? ?Trouble staying asleep. ?Loud snoring. ?Headaches in the  morning. ?Waking up gasping. ?Dry mouth or sore throat in the morning. ?Being sleepy or tired during the day. ?If you are sleepy or tired during the day, you may also: ?Not be able to focus your mind (concentrate). ?Forget things. ?Get angry a lot and have mood swings. ?Feel sad (depressed). ?Have changes in your personality. ?Have less interest in sex, if you are female. ?Be unable to have an erection, if you are female. ?How is this treated? ? ?Sleeping on your side. ?Using a medicine to get rid of mucus in your nose (decongestant). ?Avoiding the use of alcohol, medicines to help you relax, or certain pain medicines (narcotics). ?Losing weight, if needed. ?Changing your diet. ?Quitting smoking. ?Using a machine to open your airway while you sleep, such as: ?An oral appliance. This is a mouthpiece that shifts your lower jaw forward. ?A CPAP device. This device blows air through a mask when you breathe out (exhale). ?An EPAP device. This has valves that you put in each nostril. ?A BIPAP device. This device blows air through a mask when you breathe in (inhale) and breathe out. ?Having surgery if other treatments do not work. ?Follow these instructions at home: ?Lifestyle ?Make changes that your doctor recommends. ?Eat a healthy diet. ?Lose weight if needed. ?Avoid alcohol, medicines to help you relax, and some pain medicines. ?Do not smoke or use any products that contain nicotine or tobacco. If you need help quitting, ask your doctor. ?General instructions ?Take over-the-counter and prescription medicines only as told by your doctor. ?If you were given a machine to use while you sleep, use it only as told by your doctor. ?If you are having surgery, make sure to tell your doctor you have sleep apnea.  You may need to bring your device with you. ?Keep all follow-up visits. ?Contact a doctor if: ?The machine that you were given to use during sleep bothers you or does not seem to be working. ?You do not get better. ?You  get worse. ?Get help right away if: ?Your chest hurts. ?You have trouble breathing in enough air. ?You have an uncomfortable feeling in your back, arms, or stomach. ?You have trouble talking. ?One side of your body feels weak. ?A part of your face is hanging down. ?These symptoms may be an emergency. Get help right away. Call your local emergency services (911 in the [place] ?Do not wait to see if the symptoms will go away. ?Do not drive yourself to the hospital. ?Summary ?This condition affects breathing during sleep. ?The most common cause is a collapsed or blocked airway. ?The goal of treatment is to help you breathe normally while you sleep. ?This information is not intended to replace advice given to you by your health care provider. Make sure you discuss any questions you have with your health care provider. ?Document Revised: 07/26/2021 Document Reviewed: 11/25/2020 ?Elsevier Patient Education ? [place]. ? ?

## 2022-04-11 NOTE — Progress Notes (Signed)
@Patient  ID: , female    DOB: 11/30/1951, 71 y.o.   MRN: [id number]  No chief complaint on file.   Referring provider: [id number], MD  HPI: 71 year old female, former smoker. PMH significant for mixed hyperlipidemia, palpitation.   Previous [facility] pulmonary encounter: 11/15/21- Dr. 11/17/21 She is followed by cardiology for atrial fibrillation.  There was concern she could has sleep apnea contributing to this.  Her symptoms started when she noticed palpitations when laying down to sleep.  She snores intermittently.  She has trouble staying awake while watching TV.  She goes to sleep between 9 and 10 pm.  She falls asleep in 5 to 10 minutes.  She wakes up 2 or 3 times to use the bathroom.  She gets out of bed at 530 am.  She feels good in the morning.  She denies morning headache.  She does not use anything to help her fall sleep or stay awake.  She denies sleep walking, sleep talking, bruxism, or nightmares.  There is no history of restless legs.  She denies sleep hallucinations, sleep paralysis, or cataplexy.  The Epworth score is 2 out of 24.   04/11/2022 Patient presents today to discuss sleep study results. HST 03/08/22 >> AHI 14.2, SpO2 low 82%. We discussed sleep study results, risks of untreated sleep apnea and treatment options. Home sleep study showed mild OSA. Treatment options include weight management, side sleeping position, oral appliance, CPAP therapy or referral to ENT for possible surgical options. She has elected to conservatively manage with positional sleep and referral to orthodontics for consideration for oral appliance.     No Known Allergies  Immunization History  Administered Date(s) Administered   Fluad Quad(high Dose 65+) 09/14/2019   Influenza-Unspecified 08/31/2021    Past Medical History:  Diagnosis Date   Hyperlipidemia    Osteoporosis    Paroxysmal atrial fibrillation (HCC)    Preglaucoma    Valvular heart disease      Tobacco History: Social History   Tobacco Use  Smoking Status Former   Types: Cigarettes   Quit date: 01/16/1981   Years since quitting: 41.3  Smokeless Tobacco Never   Counseling given: Not Answered   Outpatient Medications Prior to Visit  Medication Sig Dispense Refill   atorvastatin (LIPITOR) 10 MG tablet Take 10 mg by mouth as directed. Monday and Friday only     calcium carbonate (OS-CAL - DOSED IN MG OF ELEMENTAL CALCIUM) 1250 (500 Ca) MG tablet Take 1 tablet by mouth.     cholecalciferol (VITAMIN D) 1000 units tablet Take 1,000 Units by mouth daily.     denosumab (PROLIA) 60 MG/ML SOSY injection Inject 60 mg into the skin every 6 (six) months.     flecainide (TAMBOCOR) 50 MG tablet Take 1.5 tablets (75 mg total) by mouth 2 (two) times daily. 270 tablet 3   metoprolol succinate (TOPROL-XL) 25 MG 24 hr tablet TAKE 1/2 TABLET(12.5 MG) BY MOUTH DAILY 45 tablet 1   apixaban (ELIQUIS) 5 MG TABS tablet Take 1 tablet (5 mg total) by mouth 2 (two) times daily. 60 tablet 6   No facility-administered medications prior to visit.    Review of Systems  Review of Systems  Constitutional: Negative.  Negative for fever.  HENT: Negative.    Respiratory: Negative.  Negative for apnea.   Psychiatric/Behavioral:  Positive for sleep disturbance.     Physical Exam  BP 112/70 (BP Location: Right Arm, Patient Position: Sitting, Cuff Size: Normal)  Pulse (!) 55   Temp 99.5 F (37.5 C) (Oral)   Ht 5\' 8"  (1.727 m)   Wt 137 lb 12.8 oz (62.5 kg)   SpO2 99%   BMI 20.95 kg/m  Physical Exam Constitutional:      Appearance: Normal appearance.  HENT:     Head: Normocephalic and atraumatic.  Cardiovascular:     Rate and Rhythm: Normal rate.     Comments: RRR Pulmonary:     Effort: Pulmonary effort is normal.     Breath sounds: No wheezing, rhonchi or rales.  Neurological:     General: No focal deficit present.     Mental Status: She is alert and oriented to person, place, and  time. Mental status is at baseline.  Psychiatric:        Mood and Affect: Mood normal.        Behavior: Behavior normal.        Thought Content: Thought content normal.        Judgment: Judgment normal.     Lab Results:  CBC No results found for: WBC, RBC, HGB, HCT, PLT, MCV, MCH, MCHC, RDW, LYMPHSABS, MONOABS, EOSABS, BASOSABS  BMET No results found for: NA, K, CL, CO2, GLUCOSE, BUN, CREATININE, CALCIUM, GFRNONAA, GFRAA  BNP No results found for: BNP  ProBNP No results found for: PROBNP  Imaging: No results found.   Assessment & Plan:   Mild sleep apnea - HST 03/08/22 >> AHI 14.2, SpO2 low 82%.  - We discussed sleep study results, risks of untreated sleep apnea and treatment options. At this time we have elected to conservatively manage with positional sleep therapy and referral to orthodontics for consideration for oral appliance - Advised against driving if experiencing excessive daytime sleepiness or somnolence - If sleep becomes more restless, snoring worsenings or A-fib is not controlled please return to see 05/08/22 sooner  Referral: -Orthodontics re: mild sleep apnea (Dr. [place]- [phone])  Follow-up - 6 months with Beth NP    [phone], NP 05/21/2022

## 2022-04-12 ENCOUNTER — Encounter: Payer: Self-pay | Admitting: Primary Care

## 2022-04-12 DIAGNOSIS — E782 Mixed hyperlipidemia: Secondary | ICD-10-CM | POA: Diagnosis not present

## 2022-04-12 DIAGNOSIS — M810 Age-related osteoporosis without current pathological fracture: Secondary | ICD-10-CM | POA: Diagnosis not present

## 2022-04-12 DIAGNOSIS — I480 Paroxysmal atrial fibrillation: Secondary | ICD-10-CM | POA: Diagnosis not present

## 2022-04-19 ENCOUNTER — Ambulatory Visit (HOSPITAL_COMMUNITY): Payer: Medicare Other

## 2022-04-25 ENCOUNTER — Other Ambulatory Visit: Payer: Self-pay | Admitting: Cardiology

## 2022-04-25 NOTE — Telephone Encounter (Signed)
Prescription refill request for Eliquis received. ?Indication: PAF  ?Last office visit: 03/06/22  Ival Bible MD ?Scr: 0.76 on 08/21/21 ?Age: 71 ?Weight: 63.9kg ? ?Based on above findings Eliquis 5mg  twice daily is the appropriate dose.  Refill approved. ? ?

## 2022-04-27 DIAGNOSIS — M810 Age-related osteoporosis without current pathological fracture: Secondary | ICD-10-CM | POA: Diagnosis not present

## 2022-05-21 DIAGNOSIS — G4730 Sleep apnea, unspecified: Secondary | ICD-10-CM | POA: Insufficient documentation

## 2022-05-21 NOTE — Assessment & Plan Note (Signed)
-   HST 03/08/22 >> AHI 14.2, SpO2 low 82%.  - We discussed sleep study results, risks of untreated sleep apnea and treatment options. At this time we have elected to conservatively manage with positional sleep therapy and referral to orthodontics for consideration for oral appliance - Advised against driving if experiencing excessive daytime sleepiness or somnolence - If sleep becomes more restless, snoring worsenings or A-fib is not controlled please return to see [place] sooner  Referral: -Orthodontics re: mild sleep apnea (Dr. Irene Limbo- [phone])  Follow-up - 6 months with Gloria Sandy NP

## 2022-06-28 DIAGNOSIS — H35371 Puckering of macula, right eye: Secondary | ICD-10-CM | POA: Diagnosis not present

## 2022-09-15 ENCOUNTER — Other Ambulatory Visit: Payer: Self-pay | Admitting: Cardiology

## 2022-09-25 ENCOUNTER — Ambulatory Visit: Payer: Medicare Other | Admitting: Cardiology

## 2022-10-18 ENCOUNTER — Other Ambulatory Visit (HOSPITAL_COMMUNITY): Payer: Self-pay | Admitting: Internal Medicine

## 2022-10-18 DIAGNOSIS — Z1231 Encounter for screening mammogram for malignant neoplasm of breast: Secondary | ICD-10-CM

## 2022-10-23 DIAGNOSIS — E782 Mixed hyperlipidemia: Secondary | ICD-10-CM | POA: Diagnosis not present

## 2022-10-30 ENCOUNTER — Other Ambulatory Visit (HOSPITAL_COMMUNITY): Payer: Self-pay | Admitting: Family Medicine

## 2022-10-30 ENCOUNTER — Encounter (HOSPITAL_COMMUNITY)
Admission: RE | Admit: 2022-10-30 | Discharge: 2022-10-30 | Disposition: A | Discharge status: Home / Self Care | Payer: Medicare Other | Source: Ambulatory Visit | Attending: Internal Medicine | Admitting: Internal Medicine

## 2022-10-30 VITALS — BP 107/48 | HR 58 | Temp 98.5°F | Resp 16

## 2022-10-30 DIAGNOSIS — M810 Age-related osteoporosis without current pathological fracture: Secondary | ICD-10-CM | POA: Diagnosis not present

## 2022-10-30 DIAGNOSIS — E782 Mixed hyperlipidemia: Secondary | ICD-10-CM | POA: Diagnosis not present

## 2022-10-30 DIAGNOSIS — Z0000 Encounter for general adult medical examination without abnormal findings: Secondary | ICD-10-CM | POA: Diagnosis not present

## 2022-10-30 DIAGNOSIS — E559 Vitamin D deficiency, unspecified: Secondary | ICD-10-CM | POA: Diagnosis not present

## 2022-10-30 DIAGNOSIS — I480 Paroxysmal atrial fibrillation: Secondary | ICD-10-CM | POA: Diagnosis not present

## 2022-10-30 MED ORDER — DENOSUMAB 60 MG/ML ~~LOC~~ SOSY
60.0000 mg | PREFILLED_SYRINGE | Freq: Once | SUBCUTANEOUS | Status: AC
  Administered 2022-10-30: 60 mg via SUBCUTANEOUS
  Filled 2022-10-30: qty 1

## 2022-10-30 NOTE — Progress Notes (Addendum)
Diagnosis: Osteoporosis  Provider:  Wende Neighbors MD  Procedure: Injection  Prolia (Denosumab), Dose: 60 mg, Site: subcutaneous, Number of injections: 1  Post Care: Observation period completed  Discharge: Condition: Good, Destination: Home . AVS provided to patient.   Performed by:  Oren Beckmann, RN

## 2022-10-31 ENCOUNTER — Ambulatory Visit (HOSPITAL_COMMUNITY): Payer: Medicare Other

## 2022-10-31 ENCOUNTER — Encounter: Payer: Self-pay | Admitting: Internal Medicine

## 2022-11-01 ENCOUNTER — Ambulatory Visit (HOSPITAL_COMMUNITY)
Admission: RE | Admit: 2022-11-01 | Discharge: 2022-11-01 | Disposition: A | Discharge status: Home / Self Care | Payer: Medicare Other | Source: Ambulatory Visit | Attending: Internal Medicine | Admitting: Internal Medicine

## 2022-11-01 ENCOUNTER — Ambulatory Visit (HOSPITAL_COMMUNITY)
Admission: RE | Admit: 2022-11-01 | Discharge: 2022-11-01 | Disposition: A | Discharge status: Home / Self Care | Payer: Medicare Other | Source: Ambulatory Visit | Attending: Family Medicine | Admitting: Family Medicine

## 2022-11-01 DIAGNOSIS — M810 Age-related osteoporosis without current pathological fracture: Secondary | ICD-10-CM | POA: Insufficient documentation

## 2022-11-01 DIAGNOSIS — Z1231 Encounter for screening mammogram for malignant neoplasm of breast: Secondary | ICD-10-CM | POA: Diagnosis not present

## 2022-11-05 NOTE — Progress Notes (Unsigned)
Cardiology Office Note  Date: 11/06/2022   ID: Gloria Stewart 09/18/1951, MRN [id number]  PCP:  Celene Squibb, MD  Cardiologist:  Rozann Lesches, MD Electrophysiologist:  None   Chief Complaint  Patient presents with   Cardiac follow-up    History of Present Illness: Gloria Stewart is a 71 y.o. female last seen in March.  She is here for a routine visit.  Reports no progressive palpitations, no significant functional limitation.  She has a goal to walk at least 5 miles each day.  I reviewed her recent lab work as outlined below.  She does not report any spontaneous bleeding problems on Eliquis.  Continues on Toprol-XL and flecainide as before.  She does not report any other major changes in health.  Past Medical History:  Diagnosis Date   Hyperlipidemia    Osteoporosis    Paroxysmal atrial fibrillation ([place])    Preglaucoma    Valvular heart disease     Past Surgical History:  Procedure Laterality Date   COLONOSCOPY N/A 02/09/2016   Procedure: COLONOSCOPY;  Surgeon: Daneil Dolin, MD;  Location: [facility];  Service: Endoscopy;  Laterality: N/A;  900 - moved to 10:15 - office to notify   TONSILLECTOMY     at 71 years old    Current Outpatient Medications  Medication Sig Dispense Refill   atorvastatin (LIPITOR) 10 MG tablet Take 10 mg by mouth as directed. Monday, wednesday and Friday only     calcium carbonate (OS-CAL - DOSED IN MG OF ELEMENTAL CALCIUM) 1250 (500 Ca) MG tablet Take 1 tablet by mouth.     cholecalciferol (VITAMIN D) 1000 units tablet Take 1,000 Units by mouth daily.     denosumab (PROLIA) 60 MG/ML SOSY injection Inject 60 mg into the skin every 6 (six) months.     ELIQUIS 5 MG TABS tablet TAKE 1 TABLET(5 MG) BY MOUTH TWICE DAILY 60 tablet 6   flecainide (TAMBOCOR) 50 MG tablet Take 1.5 tablets (75 mg total) by mouth 2 (two) times daily. 270 tablet 3   metoprolol succinate (TOPROL-XL) 25 MG 24 hr tablet TAKE 1/2 TABLET(12.5 MG) BY MOUTH DAILY  45 tablet 6   No current facility-administered medications for this visit.   Allergies:  Patient has no known allergies.   ROS: No orthopnea or PND.  No unexplained syncope.  Physical Exam: VS:  BP 118/78   Pulse (!) 54   Ht 5\' 8"  (1.727 m)   Wt 142 lb (64.4 kg)   SpO2 92%   BMI 21.59 kg/m , BMI Body mass index is 21.59 kg/m.  Wt Readings from Last 3 Encounters:  11/06/22 142 lb (64.4 kg)  04/11/22 137 lb 12.8 oz (62.5 kg)  03/06/22 140 lb 12.8 oz (63.9 kg)    General: Patient appears comfortable at rest. HEENT: Conjunctiva and lids normal. Neck: Supple, no elevated JVP or carotid bruits, no thyromegaly. Lungs: Clear to auscultation, nonlabored breathing at rest. Cardiac: Regular rate and rhythm, no S3 or significant systolic murmur. Extremities: No pitting edema.  ECG:  An ECG dated 12/05/2021 was personally reviewed today and demonstrated:  Sinus bradycardia with rightward axis.  Recent Labwork:  October 2023: Cholesterol 170, triglycerides 63, HDL 64, LDL 94, BUN 25, creatinine 0.74, potassium 4.4, AST 20, ALT 18, hemoglobin 12.8, platelets 177  Other Studies Reviewed Today:  Echocardiogram 10/16/2021:  1. Left ventricular ejection fraction, by estimation, is 55 to 60%. The  left ventricle has normal function. The left  ventricle has no regional  wall motion abnormalities. Left ventricular diastolic parameters were  normal.   2. Right ventricular systolic function is normal. The right ventricular  size is normal. There is normal pulmonary artery systolic pressure. The  estimated right ventricular systolic pressure is 79.0 mmHg.   3. Left atrial size was mildly dilated.   4. The mitral valve is grossly normal. Mild to moderate mitral valve  regurgitation. No evidence of mitral stenosis.   5. Tricuspid valve regurgitation is mild to moderate.   6. The aortic valve is tricuspid. Aortic valve regurgitation is mild. No  aortic stenosis is present.   7. The inferior  vena cava is dilated in size with >50% respiratory  variability, suggesting right atrial pressure of 8 mmHg.    Cardiac monitor October 2022: ZIO XT reviewed.  6 days, 23 hours analyzed.  Predominant rhythm is sinus with heart rate ranging from 45 bpm up to 123 bpm and average heart rate 68 bpm.  There were occasional PACs representing 2% total beats with otherwise rare atrial couplets and triplets.  There were rare PVCs including couplets representing less than 1% total beats.  There were several episodes of paroxysmal atrial fibrillation with RVR, overall 33% total rhythm burden with longest lasting event being 15 hours and 20 minutes with average heart rate in the 120s.  There were also 2 brief episodes of NSVT, the longest of which was 7 beats.   GXT 11/06/2021:   No ST deviation was noted.   Prior study not available for comparison.   IMPRESSIONS Negative for stress induced arrhythmias. Estimated exercise workload is 10, which confers a survival benefit.   RECOMMENDATIONS/CONCLUSIONS Stress ECG is negative at heart rate achieved (84% predicted).  Assessment and Plan:  1.  Paroxysmal atrial fibrillation with CHA2DS2-VASc score of 2.  She remains on Eliquis for stroke prophylaxis, I reviewed her recent lab work.  Continue Toprol-XL and flecainide at current doses.  Plan ECG for next visit.  2.  Mild to moderate mitral and tricuspid regurgitation, asymptomatic.  Last echocardiogram was in October 2022.  Continue observation for now.  Medication Adjustments/Labs and Tests Ordered: Current medicines are reviewed at length with the patient today.  Concerns regarding medicines are outlined above.   Tests Ordered: No orders of the defined types were placed in this encounter.   Medication Changes: No orders of the defined types were placed in this encounter.   Disposition:  Follow up  6 months.  Signed, Satira Sark, MD, [facility] 11/06/2022 11:26 AM    [place] [place]. [address] [place], [place] [address] Phone: [phone]; Fax: [phone]

## 2022-11-06 ENCOUNTER — Encounter: Payer: Self-pay | Admitting: Cardiology

## 2022-11-06 ENCOUNTER — Encounter: Payer: Medicare Other | Attending: Cardiology | Admitting: Cardiology

## 2022-11-06 VITALS — BP 118/78 | HR 54 | Ht 68.0 in | Wt 142.0 lb

## 2022-11-06 DIAGNOSIS — I340 Nonrheumatic mitral (valve) insufficiency: Secondary | ICD-10-CM | POA: Insufficient documentation

## 2022-11-06 DIAGNOSIS — I480 Paroxysmal atrial fibrillation: Secondary | ICD-10-CM | POA: Diagnosis not present

## 2022-11-06 NOTE — Patient Instructions (Signed)
Medication Instructions:  Your physician recommends that you continue on your current medications as directed. Please refer to the Current Medication list given to you today.   Labwork: None today  Testing/Procedures: None today  Follow-Up: 6 months  Any Other Special Instructions Will Be Listed Below (If Applicable).  If you need a refill on your cardiac medications before your next appointment, please call your pharmacy.  

## 2022-11-12 ENCOUNTER — Encounter: Payer: Self-pay | Admitting: Primary Care

## 2022-11-12 ENCOUNTER — Ambulatory Visit (INDEPENDENT_AMBULATORY_CARE_PROVIDER_SITE_OTHER): Payer: Medicare Other | Admitting: Primary Care

## 2022-11-12 VITALS — BP 120/80 | HR 50 | Ht 68.0 in | Wt 144.6 lb

## 2022-11-12 DIAGNOSIS — G4730 Sleep apnea, unspecified: Secondary | ICD-10-CM

## 2022-11-12 NOTE — Assessment & Plan Note (Addendum)
-   Mild sleep apnea is currently well controlled using oral appliance - Home sleep study in March 2023 showed mild obstructive sleep apnea, AHI 14.2/hour with SPO2 low 82%. Reviewed treatment options. Referred to orthodontics. She has established with Dr. Toni Arthurs, DDS and received oral appliance.  Repeat sleep testing showed no residual apneas while using device. - Advised patient return to our office if she develops worsening sleep symptoms or if she is unable to continue to wear oral appliance.

## 2022-11-12 NOTE — Progress Notes (Signed)
Reviewed and agree with assessment/plan.   Coralyn Helling, MD [facility] Pulmonary/Critical Care 11/12/2022, 10:35 AM Pager:  [phone]

## 2022-11-12 NOTE — Progress Notes (Signed)
@Patient  ID: , female    DOB: [birth year], 71 y.o.   MRN: [id number]  Chief Complaint  Patient presents with   Follow-up    Referring provider: [id number], MD  HPI: 71 year old female, former smoker. PMH significant for mixed hyperlipidemia, palpitation.   Previous [facility] pulmonary encounter: 11/15/21- Dr. 11/17/21 She is followed by cardiology for atrial fibrillation.  There was concern she could has sleep apnea contributing to this.  Her symptoms started when she noticed palpitations when laying down to sleep.  She snores intermittently.  She has trouble staying awake while watching TV.  She goes to sleep between 9 and 10 pm.  She falls asleep in 5 to 10 minutes.  She wakes up 2 or 3 times to use the bathroom.  She gets out of bed at 530 am.  She feels good in the morning.  She denies morning headache.  She does not use anything to help her fall sleep or stay awake.  She denies sleep walking, sleep talking, bruxism, or nightmares.  There is no history of restless legs.  She denies sleep hallucinations, sleep paralysis, or cataplexy.  The Epworth score is 2 out of 24.  04/11/2022 Patient presents today to discuss sleep study results. HST 03/08/22 >> AHI 14.2, SpO2 low 82%. We discussed sleep study results, risks of untreated sleep apnea and treatment options. Home sleep study showed mild OSA. Treatment options include weight management, side sleeping position, oral appliance, CPAP therapy or referral to ENT for possible surgical options. She has elected to conservatively manage with positional sleep and referral to orthodontics for consideration for oral appliance.    11/12/2022- Interim hx  Patient presents today for sleep follow-up.  Seen for initial consult back in November 2022.  She had a home sleep study in March 2023 that showed mild obstructive sleep apnea, AHI 14.2, SpO2 low 82%.  We discussed treatment options including weight loss, side sleeping position, oral  appliance, CPAP therapy or referral to ENT for possible surgical options.  During her last visit we elected to conservatively manage OSA with positional sleep therapy and she was referred to orthodontics for consideration for oral appliance.   She saw Dr. April 2023 and received oral appliance. She had repeat sleep testing with her office and she had no residual events. It took her some time to get use to appliance. She is unsure if she ever snored. No recent episodes of heart palpitations.  No Known Allergies  Immunization History  Administered Date(s) Administered   Fluad Quad(high Dose 65+) 09/14/2019   Influenza-Unspecified 08/31/2021    Past Medical History:  Diagnosis Date   Hyperlipidemia    Osteoporosis    Paroxysmal atrial fibrillation (HCC)    Preglaucoma    Valvular heart disease     Tobacco History: Social History   Tobacco Use  Smoking Status Former   Types: Cigarettes   Quit date: 01/16/1981   Years since quitting: 41.8  Smokeless Tobacco Never   Counseling given: Not Answered   Outpatient Medications Prior to Visit  Medication Sig Dispense Refill   atorvastatin (LIPITOR) 10 MG tablet Take 10 mg by mouth as directed. Monday, wednesday and Friday only     calcium carbonate (OS-CAL - DOSED IN MG OF ELEMENTAL CALCIUM) 1250 (500 Ca) MG tablet Take 1 tablet by mouth.     cholecalciferol (VITAMIN D) 1000 units tablet Take 1,000 Units by mouth daily.     denosumab (PROLIA) 60 MG/ML SOSY injection  Inject 60 mg into the skin every 6 (six) months.     ELIQUIS 5 MG TABS tablet TAKE 1 TABLET(5 MG) BY MOUTH TWICE DAILY 60 tablet 6   flecainide (TAMBOCOR) 50 MG tablet Take 1.5 tablets (75 mg total) by mouth 2 (two) times daily. 270 tablet 3   metoprolol succinate (TOPROL-XL) 25 MG 24 hr tablet TAKE 1/2 TABLET(12.5 MG) BY MOUTH DAILY 45 tablet 6   No facility-administered medications prior to visit.   Review of Systems  Review of Systems  Constitutional: Negative.  Negative  for fatigue.  HENT: Negative.    Respiratory: Negative.  Negative for apnea.   Psychiatric/Behavioral:  Negative for sleep disturbance.     Physical Exam  BP 120/80 (BP Location: Right Arm, Cuff Size: Normal)   Pulse (!) 50   Ht 5\' 8"  (1.727 m)   Wt 144 lb 9.6 oz (65.6 kg)   SpO2 98%   BMI 21.99 kg/m  Physical Exam Constitutional:      Appearance: Normal appearance.  HENT:     Head: Normocephalic and atraumatic.     Mouth/Throat:     Mouth: Mucous membranes are moist.     Pharynx: Oropharynx is clear.  Cardiovascular:     Rate and Rhythm: Normal rate and regular rhythm.  Pulmonary:     Effort: Pulmonary effort is normal.     Breath sounds: Normal breath sounds.  Musculoskeletal:        General: Normal range of motion.  Skin:    General: Skin is warm and dry.  Neurological:     General: No focal deficit present.     Mental Status: She is alert and oriented to person, place, and time. Mental status is at baseline.  Psychiatric:        Mood and Affect: Mood normal.        Behavior: Behavior normal.        Thought Content: Thought content normal.        Judgment: Judgment normal.      Lab Results:  CBC No results found for: "WBC", "RBC", "HGB", "HCT", "PLT", "MCV", "MCH", "MCHC", "RDW", "LYMPHSABS", "MONOABS", "EOSABS", "BASOSABS"  BMET No results found for: "NA", "K", "CL", "CO2", "GLUCOSE", "BUN", "CREATININE", "CALCIUM", "GFRNONAA", "GFRAA"  BNP No results found for: "BNP"  ProBNP No results found for: "PROBNP"  Imaging: MM 3D SCREEN BREAST BILATERAL  Result Date: 11/02/2022 CLINICAL DATA:  Screening. EXAM: DIGITAL SCREENING BILATERAL MAMMOGRAM WITH TOMOSYNTHESIS AND CAD TECHNIQUE: Bilateral screening digital craniocaudal and mediolateral oblique mammograms were obtained. Bilateral screening digital breast tomosynthesis was performed. The images were evaluated with computer-aided detection. COMPARISON:  Previous exam(s). ACR Breast Density Category b: There  are scattered areas of fibroglandular density. FINDINGS: There are no findings suspicious for malignancy. IMPRESSION: No mammographic evidence of malignancy. A result letter of this screening mammogram will be mailed directly to the patient. RECOMMENDATION: Screening mammogram in one year. (Code:SM-B-01Y) BI-RADS CATEGORY  1: Negative. Electronically Signed   By: 13/02/2022 III M.D.   On: 11/02/2022 19:01   DG BONE DENSITY (DXA)  Result Date: 11/01/2022 EXAM: DUAL X-RAY ABSORPTIOMETRY (DXA) FOR BONE MINERAL DENSITY IMPRESSION: Your patient Gloria Stewart completed a BMD test on 11/01/2022 using the 13/01/2022 DXA System (software version: 14.10) manufactured by [employer]. The following summarizes the results of our evaluation. Technologist: [facility] PATIENT BIOGRAPHICAL: Name: Luwanda, Starr Patient ID: Wynona Luna Birth Date: Sep 23, 1951 Height: 68.0 in. Gender: Female Exam Date: 11/01/2022 Weight: 144.0 lbs. Indications:  Caucasian, Follow up Osteoporosis, Height Loss, Post Menopausal Fractures: Treatments: Calcium, Multivitamin, Prolia, Vitamin D DENSITOMETRY RESULTS: Site         Region     Measured Date Measured Age WHO Classification Young Adult T-score BMD         %Change vs. Previous Significant Change (*) DualFemur Neck Left 11/01/2022 72.2 Osteoporosis -2.6 0.682 g/cm2 3.3% - DualFemur Neck Left 09/06/2020 70.0 Osteoporosis -2.7 0.660 g/cm2 -9.6% Yes DualFemur Neck Left 05/27/2013 62.7 Osteopenia -2.2 0.730 g/cm2 - - DualFemur Total Mean 11/01/2022 72.2 Osteoporosis -2.5 0.693 g/cm2 3.6% Yes DualFemur Total Mean 09/06/2020 70.0 Osteoporosis -2.7 0.669 g/cm2 -7.5% Yes DualFemur Total Mean 05/27/2013 62.7 Osteopenia -2.3 0.723 g/cm2 - - Left Forearm Radius 33% 11/01/2022 72.2 Osteoporosis -3.3 0.476 g/cm2 1.5% - Left Forearm Radius 33% 09/06/2020 70.0 Osteoporosis -3.4 0.469 g/cm2 - - ASSESSMENT: The BMD measured at Forearm Radius 33% is 0.476 g/cm2 with a T-score of -3.3. This patient  is considered osteoporotic according to World Health Organization [facility]) criteria. The scan quality is good. Compared with the prior study on 09/06/20, the BMD of the total mean shows a statistically significant increase. World Science writer [facility]) criteria for post-menopausal, Caucasian Women: Normal:       T-score at or above -1 SD Osteopenia:   T-score between -1 and -2.5 SD Osteoporosis: T-score at or below -2.5 SD RECOMMENDATIONS: 1. All patients should optimize calcium and vitamin D intake. 2. Consider FDA-approved medical therapies in postmenopausal women and med aged 43 years and older, based on the following: a. A hip or vertebral (clinical or morphometric) fracture b. T-score< -2.5 at the femoral neck or spine after appropriate evaluation to exclude secondary causes c. Low bone mass (T-score between -1.0 and -2.5 at the femoral neck or spine) and a 10-year probability of a hip fracture > 3% or a 10-year probability of a major osteoporosis-related fracture > 20% based on the [place]-adapted WHO algorithm d. Clinician judgment and/or patient preferences may indicate treatment for people with 10-year fracture probabilities above or below these levels FOLLOW-UP: Patients with diagnosis of osteoporosis or at high risk for fracture should have regular bone mineral density tests. For patients eligible for Medicare, routine testing is allowed once every 2 years. The testing frequency can be increased to one year for patients who have rapidly progressing disease, those who are receiving or discontinuing medical therapy to restore bone mass, or have additional risk factors. I have reviewed this report, and agree with the above findings. [facility] Electronically Signed   By: Frederico Hamman M.D.   On: 11/01/2022 11:25     Assessment & Plan:   Mild sleep apnea - Mild sleep apnea is currently well controlled using oral appliance - Home sleep study in March 2023 showed mild obstructive sleep  apnea, AHI 14.2/hour with SPO2 low 82%. Reviewed treatment options. Referred to orthodontics. She has established with Dr. Toni Arthurs, DDS and received oral appliance.  Repeat sleep testing showed no residual apneas while using device. - Advised patient return to our office if she develops worsening sleep symptoms or if she is unable to continue to wear oral appliance.    Glenford Bayley, NP 11/12/2022

## 2022-11-12 NOTE — Patient Instructions (Signed)
Home sleep study in March 2023 showed mild OSA, treating with oral appliance by Dr. Toy Cookey  Recommendations: Continue to wear oral appliance while sleep.  Maintain normal BMI.  Do not drive if experiencing excessive daytime sleepiness.  Avoid alcohol/sedating medication prior to bedtime as these can worsen sleep apnea   Return to [facility] if you develop worsening snoring symptoms, waking up gasping for air/choking, morning headaches, daytime sleepiness OR can no longer use oral appliance  Follow-up: As needed only   Sleep Apnea Sleep apnea affects breathing during sleep. It causes breathing to stop for 10 seconds or more, or to become shallow. People with sleep apnea usually snore loudly. It can also increase the risk of: Heart attack. Stroke. Being very overweight (obese). Diabetes. Heart failure. Irregular heartbeat. High blood pressure. The goal of treatment is to help you breathe normally again. What are the causes?  The most common cause of this condition is a collapsed or blocked airway. There are three kinds of sleep apnea: Obstructive sleep apnea. This is caused by a blocked or collapsed airway. Central sleep apnea. This happens when the brain does not send the right signals to the muscles that control breathing. Mixed sleep apnea. This is a combination of obstructive and central sleep apnea. What increases the risk? Being overweight. Smoking. Having a small airway. Being older. Being female. Drinking alcohol. Taking medicines to calm yourself (sedatives or tranquilizers). Having family members with the condition. Having a tongue or tonsils that are larger than normal. What are the signs or symptoms? Trouble staying asleep. Loud snoring. Headaches in the morning. Waking up gasping. Dry mouth or sore throat in the morning. Being sleepy or tired during the day. If you are sleepy or tired during the day, you may also: Not be able to focus your mind  (concentrate). Forget things. Get angry a lot and have mood swings. Feel sad (depressed). Have changes in your personality. Have less interest in sex, if you are female. Be unable to have an erection, if you are female. How is this treated?  Sleeping on your side. Using a medicine to get rid of mucus in your nose (decongestant). Avoiding the use of alcohol, medicines to help you relax, or certain pain medicines (narcotics). Losing weight, if needed. Changing your diet. Quitting smoking. Using a machine to open your airway while you sleep, such as: An oral appliance. This is a mouthpiece that shifts your lower jaw forward. A CPAP device. This device blows air through a mask when you breathe out (exhale). An EPAP device. This has valves that you put in each nostril. A BIPAP device. This device blows air through a mask when you breathe in (inhale) and breathe out. Having surgery if other treatments do not work. Follow these instructions at home: Lifestyle Make changes that your doctor recommends. Eat a healthy diet. Lose weight if needed. Avoid alcohol, medicines to help you relax, and some pain medicines. Do not smoke or use any products that contain nicotine or tobacco. If you need help quitting, ask your doctor. General instructions Take over-the-counter and prescription medicines only as told by your doctor. If you were given a machine to use while you sleep, use it only as told by your doctor. If you are having surgery, make sure to tell your doctor you have sleep apnea. You may need to bring your device with you. Keep all follow-up visits. Contact a doctor if: The machine that you were given to use during sleep bothers you  or does not seem to be working. You do not get better. You get worse. Get help right away if: Your chest hurts. You have trouble breathing in enough air. You have an uncomfortable feeling in your back, arms, or stomach. You have trouble talking. One side  of your body feels weak. A part of your face is hanging down. These symptoms may be an emergency. Get help right away. Call your local emergency services (911 in the [place] Do not wait to see if the symptoms will go away. Do not drive yourself to the hospital. Summary This condition affects breathing during sleep. The most common cause is a collapsed or blocked airway. The goal of treatment is to help you breathe normally while you sleep. This information is not intended to replace advice given to you by your health care provider. Make sure you discuss any questions you have with your health care provider. Document Revised: 07/26/2021 Document Reviewed: 11/25/2020 Elsevier Patient Education  2023 [employer].

## 2022-11-14 ENCOUNTER — Other Ambulatory Visit: Payer: Self-pay | Admitting: Cardiology

## 2022-11-14 NOTE — Telephone Encounter (Signed)
Prescription refill request for Eliquis received. Indication: PAF Last office visit: 11/06/22  Ival Bible MD Scr: 0.74 on 10/23/22 Age: 71 Weight: 64.4kg  Based on above findings Eliquis 5mg  twice daily is the appropriate dose.  Refill approved.

## 2022-12-09 IMAGING — MG MM DIGITAL SCREENING BILAT W/ TOMO AND CAD
8 series · 9 of 24 positions shown · non-contrast
Comparison: Previous exam(s).

CLINICAL DATA: Screening.

EXAM:
DIGITAL SCREENING BILATERAL MAMMOGRAM WITH TOMOSYNTHESIS AND CAD
TECHNIQUE: Bilateral screening digital craniocaudal and mediolateral oblique
mammograms were obtained. Bilateral screening digital breast
tomosynthesis was performed. The images were evaluated with
computer-aided detection.

[R MLO synth-2D]
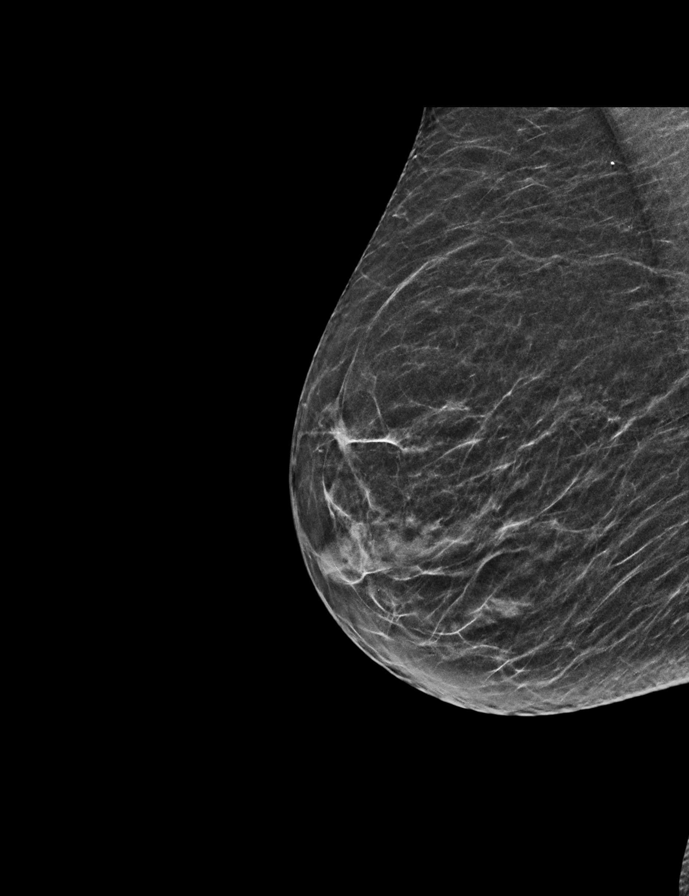

[L CC synth-2D]
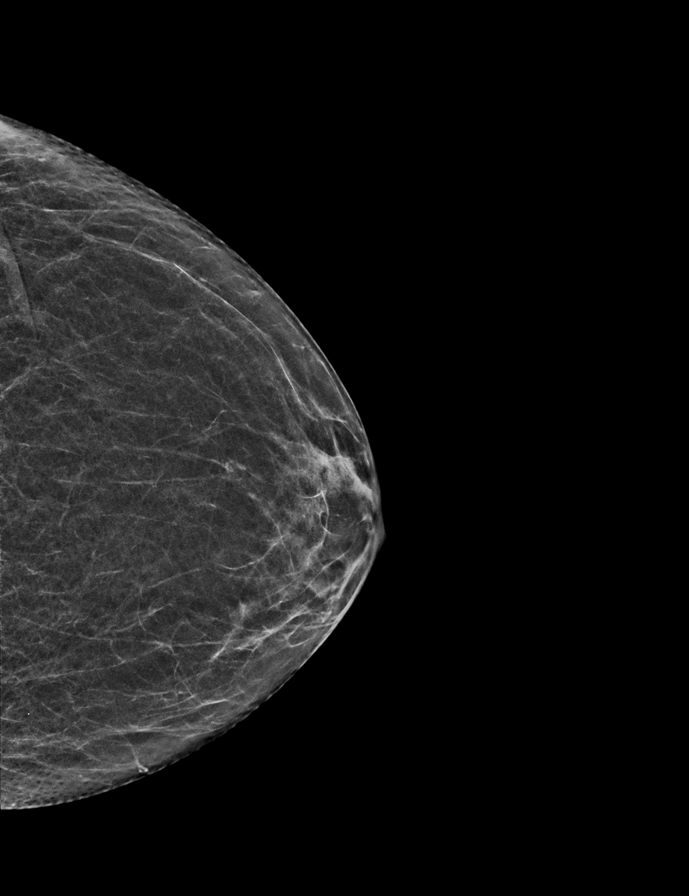

[L MLO synth-2D]
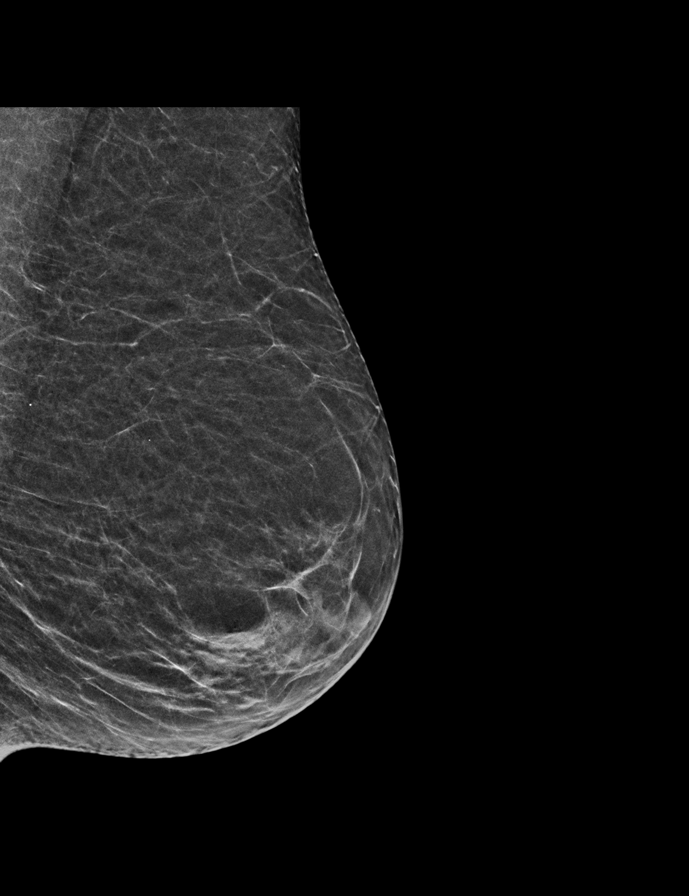

[R CC synth-2D]
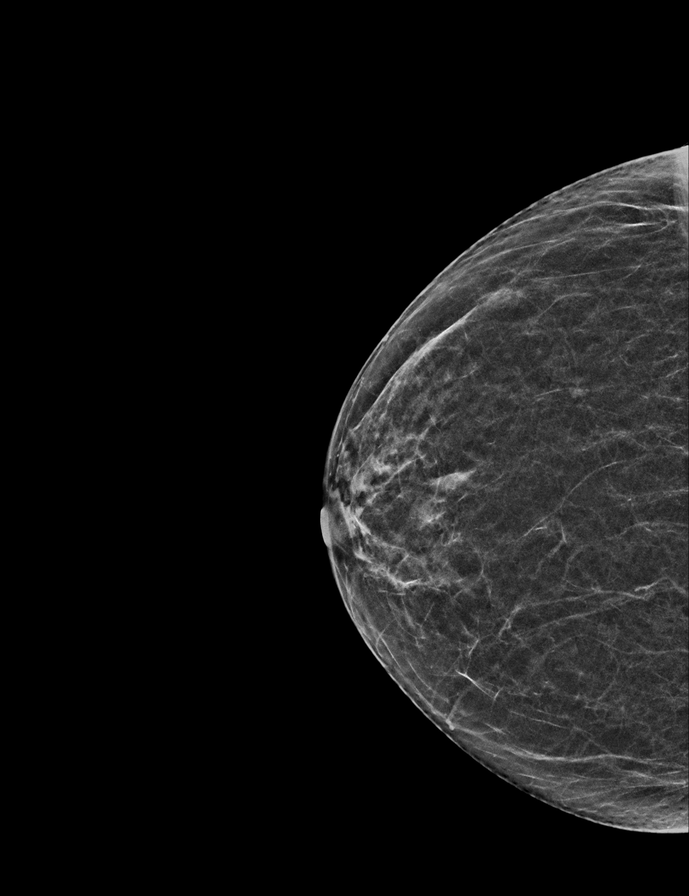

[R MLO tomo · 2 of 43 frames shown]
[frame 14/43]
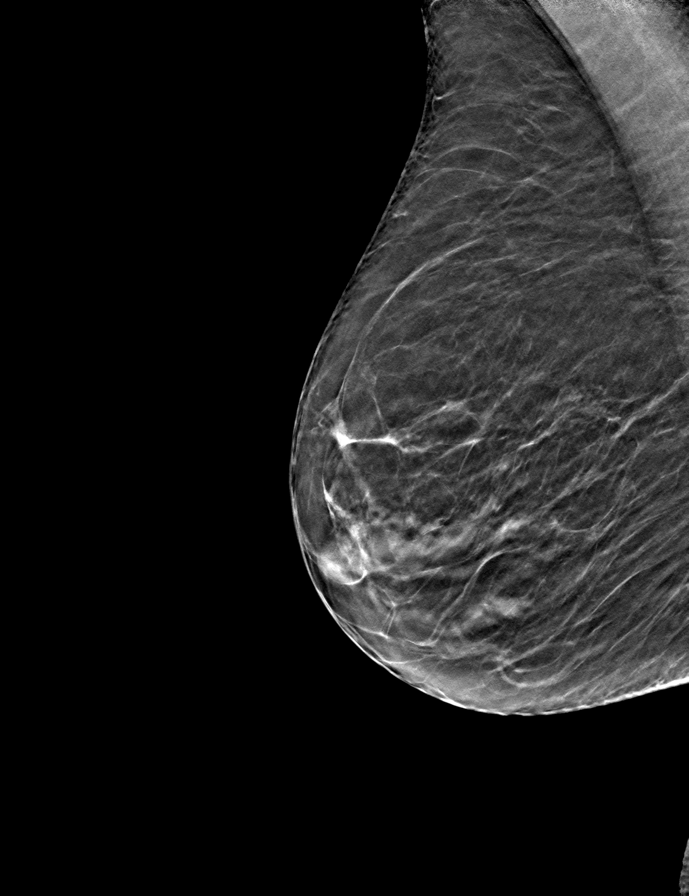
[frame 22/43]
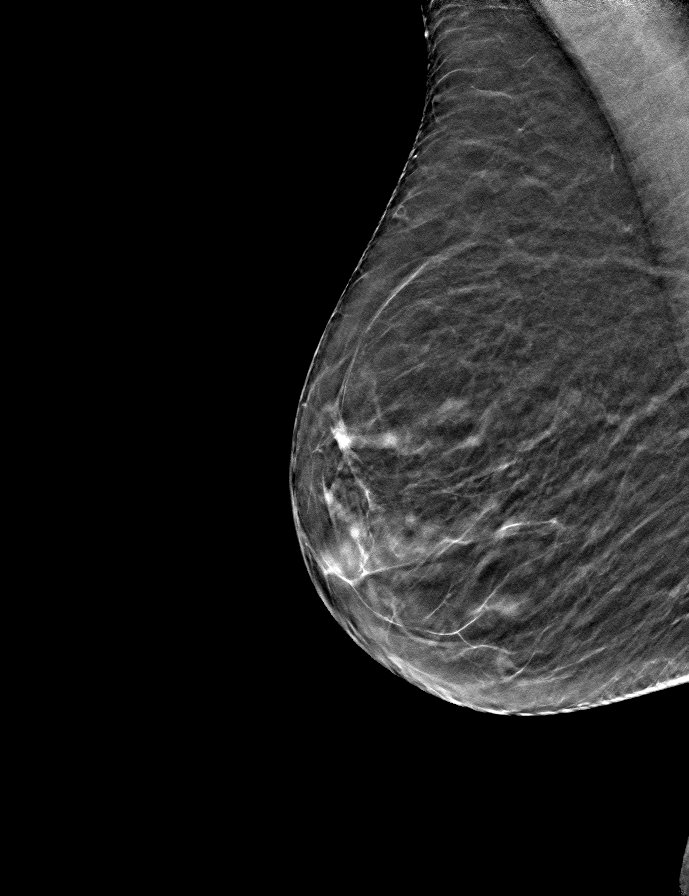

[L MLO tomo · tomo slice 22/43.0]
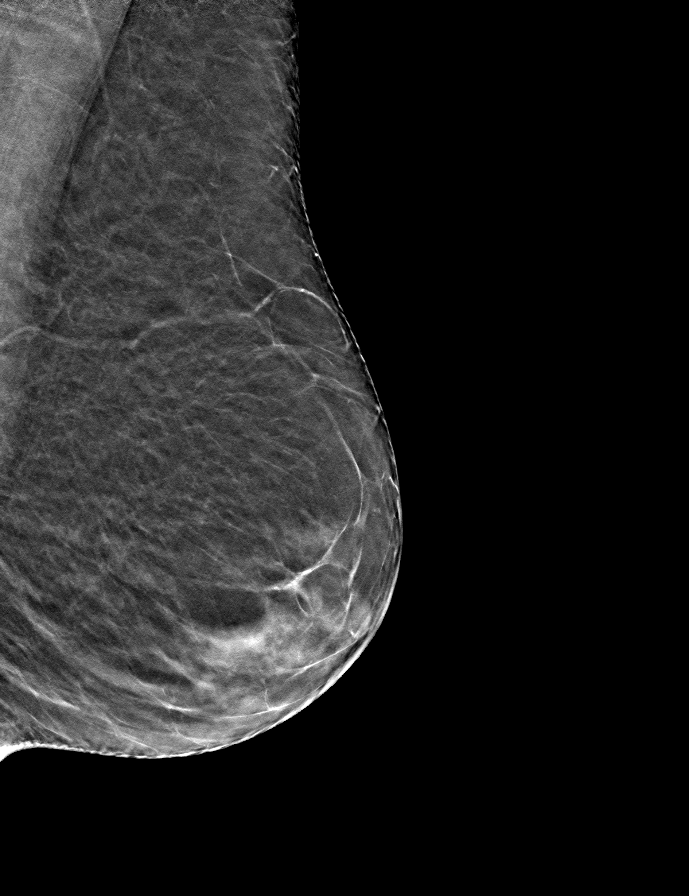

[L CC tomo · tomo slice 22/43.0]
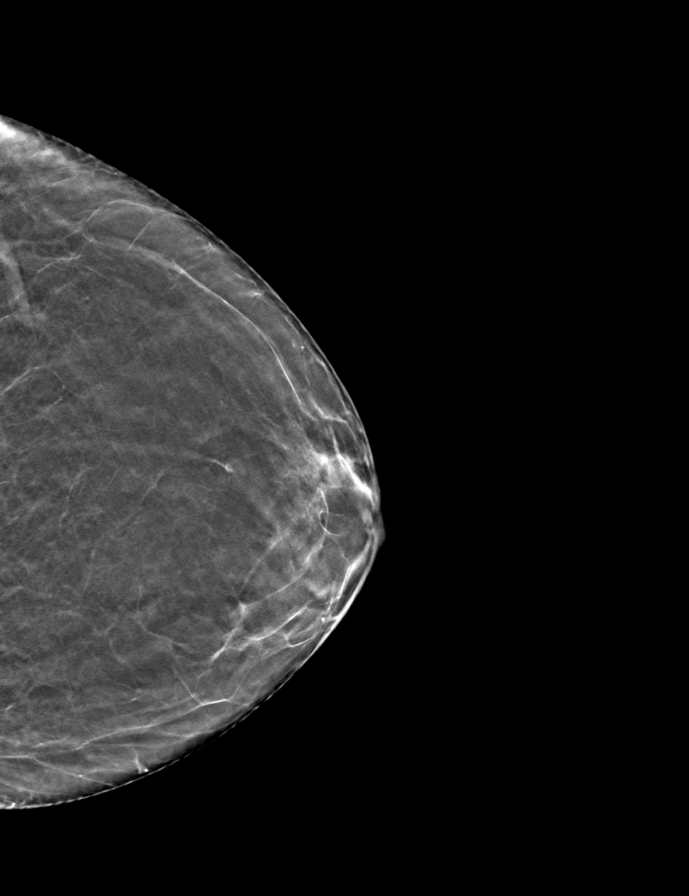

[R CC tomo · tomo slice 21/40.0]
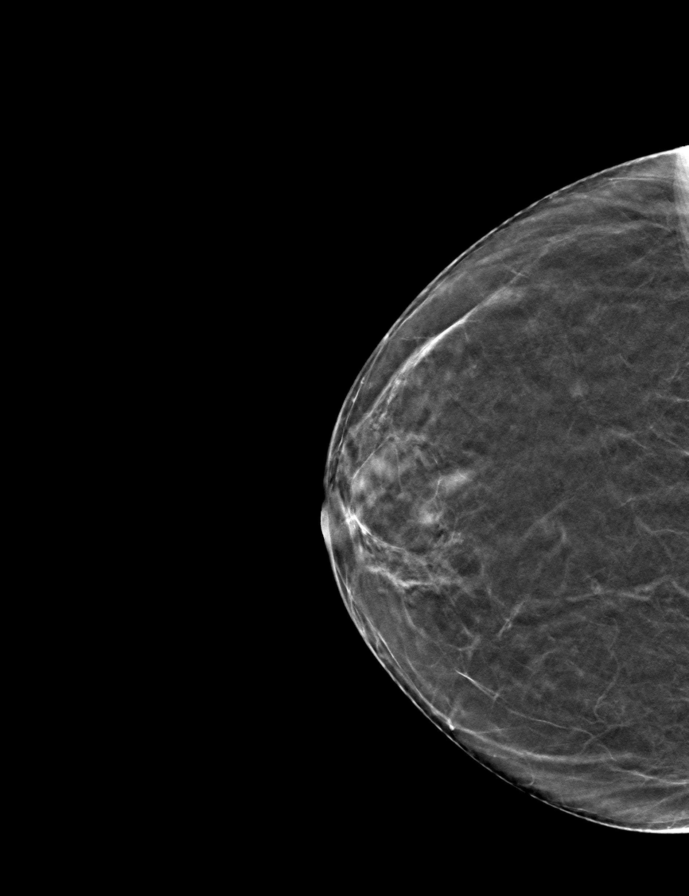

[9 of 24 positions shown; findings below may reference images not displayed]

ACR Breast Density Category b: There are scattered areas of
fibroglandular density.
FINDINGS: There are no findings suspicious for malignancy.
IMPRESSION: No mammographic evidence of malignancy. A result letter of this
screening mammogram will be mailed directly to the patient.

RECOMMENDATION:
Screening mammogram in one year. (Code:51-O-LD2)

BI-RADS CATEGORY  1: Negative.

## 2022-12-14 ENCOUNTER — Other Ambulatory Visit: Payer: Self-pay | Admitting: Student

## 2023-01-03 DIAGNOSIS — H903 Sensorineural hearing loss, bilateral: Secondary | ICD-10-CM | POA: Diagnosis not present

## 2023-01-03 DIAGNOSIS — H6123 Impacted cerumen, bilateral: Secondary | ICD-10-CM | POA: Diagnosis not present

## 2023-01-03 DIAGNOSIS — H838X3 Other specified diseases of inner ear, bilateral: Secondary | ICD-10-CM | POA: Diagnosis not present

## 2023-04-30 ENCOUNTER — Encounter (HOSPITAL_COMMUNITY)
Admission: RE | Admit: 2023-04-30 | Discharge: 2023-04-30 | Disposition: A | Discharge status: Home / Self Care | Payer: Medicare Other | Source: Ambulatory Visit | Attending: Internal Medicine | Admitting: Internal Medicine

## 2023-04-30 VITALS — BP 108/48 | HR 57 | Temp 98.2°F | Resp 12

## 2023-04-30 DIAGNOSIS — M810 Age-related osteoporosis without current pathological fracture: Secondary | ICD-10-CM | POA: Insufficient documentation

## 2023-04-30 MED ORDER — DENOSUMAB 60 MG/ML ~~LOC~~ SOSY
60.0000 mg | PREFILLED_SYRINGE | Freq: Once | SUBCUTANEOUS | Status: AC
  Administered 2023-04-30: 60 mg via SUBCUTANEOUS

## 2023-04-30 NOTE — Progress Notes (Signed)
Diagnosis: Osteoporosis  Provider:  Dwana Melena MD  Procedure: Injection  Prolia (Denosumab), Dose: 60 mg, Site: subcutaneous, Number of injections: 1  Administered in right arm.  Post Care: Patient declined observation  Discharge: Condition: Good, Destination: Home . AVS Declined  Performed by:  Wyvonne Lenz, RN

## 2023-05-01 DIAGNOSIS — R7301 Impaired fasting glucose: Secondary | ICD-10-CM | POA: Diagnosis not present

## 2023-05-01 DIAGNOSIS — E559 Vitamin D deficiency, unspecified: Secondary | ICD-10-CM | POA: Diagnosis not present

## 2023-05-01 DIAGNOSIS — Z139 Encounter for screening, unspecified: Secondary | ICD-10-CM | POA: Diagnosis not present

## 2023-05-01 DIAGNOSIS — E782 Mixed hyperlipidemia: Secondary | ICD-10-CM | POA: Diagnosis not present

## 2023-05-08 DIAGNOSIS — E782 Mixed hyperlipidemia: Secondary | ICD-10-CM | POA: Diagnosis not present

## 2023-05-08 DIAGNOSIS — Z713 Dietary counseling and surveillance: Secondary | ICD-10-CM | POA: Diagnosis not present

## 2023-05-08 DIAGNOSIS — Z7901 Long term (current) use of anticoagulants: Secondary | ICD-10-CM | POA: Diagnosis not present

## 2023-05-08 DIAGNOSIS — Z7182 Exercise counseling: Secondary | ICD-10-CM | POA: Diagnosis not present

## 2023-05-08 DIAGNOSIS — G4733 Obstructive sleep apnea (adult) (pediatric): Secondary | ICD-10-CM | POA: Diagnosis not present

## 2023-05-08 DIAGNOSIS — M810 Age-related osteoporosis without current pathological fracture: Secondary | ICD-10-CM | POA: Diagnosis not present

## 2023-05-08 DIAGNOSIS — E559 Vitamin D deficiency, unspecified: Secondary | ICD-10-CM | POA: Diagnosis not present

## 2023-05-08 DIAGNOSIS — Z6822 Body mass index (BMI) 22.0-22.9, adult: Secondary | ICD-10-CM | POA: Diagnosis not present

## 2023-05-08 DIAGNOSIS — Z23 Encounter for immunization: Secondary | ICD-10-CM | POA: Diagnosis not present

## 2023-05-08 DIAGNOSIS — I480 Paroxysmal atrial fibrillation: Secondary | ICD-10-CM | POA: Diagnosis not present

## 2023-05-08 DIAGNOSIS — Z79899 Other long term (current) drug therapy: Secondary | ICD-10-CM | POA: Diagnosis not present

## 2023-05-13 ENCOUNTER — Other Ambulatory Visit: Payer: Self-pay | Admitting: Cardiology

## 2023-05-13 NOTE — Telephone Encounter (Signed)
Prescription refill request for Eliquis received. Indication: PAF Last office visit: 11/06/22  Ival Bible MD Scr: 0.84 on 05/01/23 Age: 72 Weight: 64.4kg  Based on above findings Eliquis 5mg  twice daily is the appropriate dose.  Refill approved.

## 2023-05-17 NOTE — Progress Notes (Unsigned)
    Cardiology Office Note  Date: 05/20/2023   ID: Gloria Stewart, Gloria Stewart 29-Mar-1951, MRN [id number]  History of Present Illness: Gloria Stewart is a 72 y.o. female last seen in November 2023.  She is here for a routine visit.  Reports one episode of breakthrough atrial fibrillation in the last 6 months.  Otherwise feels well, continues to exercise regularly.  No increasing dyspnea on exertion or change in stamina.  She recently had lab work which I reviewed.  ECG today shows sinus bradycardia.  Medications are unchanged from a cardiac perspective.  She does not report any spontaneous bleeding problems on Eliquis.  Physical Exam: VS:  BP 110/68   Pulse (!) 51   Ht 5' 7.5" (1.715 m)   Wt 144 lb 12.8 oz (65.7 kg)   SpO2 98%   BMI 22.34 kg/m , BMI Body mass index is 22.34 kg/m.  Wt Readings from Last 3 Encounters:  05/20/23 144 lb 12.8 oz (65.7 kg)  11/12/22 144 lb 9.6 oz (65.6 kg)  11/06/22 142 lb (64.4 kg)    General: Patient appears comfortable at rest. HEENT: Conjunctiva and lids normal. Neck: Supple, no elevated JVP or carotid bruits. Lungs: Clear to auscultation, nonlabored breathing at rest. Cardiac: Regular rate and rhythm, no S3, 1/6 systolic murmur.  ECG:  An ECG dated 12/05/2021 was personally reviewed today and demonstrated:  Sinus bradycardia with rightward axis.  Labwork:  May 2024: Hemoglobin 12.8, platelets 193, BUN 20, creatinine 0.84, potassium 4.6, AST 20, ALT 19, cholesterol 182, triglycerides 64, HDL 66, LDL 104, hemoglobin A1c 5.5%  Other Studies Reviewed Today:  No interval cardiac testing for review today.  Assessment and Plan:  1.  Paroxysmal atrial fibrillation with CHA2DS2-VASc score of 2.  Symptomatically controlled at this point on Toprol-XL and flecainide.  Continue Eliquis for stroke prophylaxis.  I reviewed her recent lab work.  She is in sinus rhythm today.  Continue with observation.  2.  Mitral and tricuspid regurgitation, mild to moderate  by echocardiogram in October 2022.  No change in cardiac murmur.  Can consider a follow-up echocardiogram later next year.  Disposition:  Follow up  6 months.  Signed, Jonelle Sidle, M.D., F.A.C.C. [facility]

## 2023-05-20 ENCOUNTER — Encounter: Payer: Self-pay | Admitting: Cardiology

## 2023-05-20 ENCOUNTER — Encounter: Payer: Medicare Other | Attending: Internal Medicine | Admitting: Cardiology

## 2023-05-20 VITALS — BP 110/68 | HR 51 | Ht 67.5 in | Wt 144.8 lb

## 2023-05-20 DIAGNOSIS — I480 Paroxysmal atrial fibrillation: Secondary | ICD-10-CM | POA: Insufficient documentation

## 2023-05-20 DIAGNOSIS — I340 Nonrheumatic mitral (valve) insufficiency: Secondary | ICD-10-CM | POA: Insufficient documentation

## 2023-05-20 NOTE — Patient Instructions (Addendum)

## 2023-06-12 ENCOUNTER — Other Ambulatory Visit: Payer: Self-pay | Admitting: Student

## 2023-07-02 DIAGNOSIS — H25813 Combined forms of age-related cataract, bilateral: Secondary | ICD-10-CM | POA: Diagnosis not present

## 2023-07-17 ENCOUNTER — Other Ambulatory Visit: Payer: Self-pay

## 2023-07-29 DIAGNOSIS — H01004 Unspecified blepharitis left upper eyelid: Secondary | ICD-10-CM | POA: Diagnosis not present

## 2023-07-29 DIAGNOSIS — H01001 Unspecified blepharitis right upper eyelid: Secondary | ICD-10-CM | POA: Diagnosis not present

## 2023-07-29 DIAGNOSIS — H01002 Unspecified blepharitis right lower eyelid: Secondary | ICD-10-CM | POA: Diagnosis not present

## 2023-07-29 DIAGNOSIS — H35373 Puckering of macula, bilateral: Secondary | ICD-10-CM | POA: Diagnosis not present

## 2023-07-29 DIAGNOSIS — H25813 Combined forms of age-related cataract, bilateral: Secondary | ICD-10-CM | POA: Diagnosis not present

## 2023-08-22 ENCOUNTER — Other Ambulatory Visit: Payer: Self-pay

## 2023-08-23 ENCOUNTER — Telehealth: Payer: Self-pay

## 2023-08-23 NOTE — Telephone Encounter (Signed)
Auth Submission: NO AUTH NEEDED Site of care: Site of care: [facility] Payer: Medicare A/B with BCBS supplement Medication & CPT/J Code(s) submitted: Prolia (Denosumab) [id number] Reference number: Spoke to [place] D at [facility] to confirm no Berkley Harvey is needed on 08/23/23 Approval from: 08-23-23 to 12-31-23

## 2023-09-30 ENCOUNTER — Encounter (HOSPITAL_COMMUNITY)
Admission: RE | Admit: 2023-09-30 | Discharge: 2023-09-30 | Disposition: A | Discharge status: Home / Self Care | Payer: Medicare Other | Source: Ambulatory Visit | Attending: Ophthalmology | Admitting: Ophthalmology

## 2023-09-30 DIAGNOSIS — H25811 Combined forms of age-related cataract, right eye: Secondary | ICD-10-CM | POA: Diagnosis not present

## 2023-10-01 ENCOUNTER — Encounter (HOSPITAL_COMMUNITY): Payer: Self-pay

## 2023-10-02 NOTE — H&P (Signed)
Surgical History & Physical  Patient Name: Gloria Stewart  DOB: 1951-01-29  Surgery: Cataract extraction with intraocular lens implant phacoemulsification; Right Eye Surgeon: Fabio Pierce MD Surgery Date: 10/04/2023 Pre-Op Date: 07/29/2023  HPI: A 79 Yr. old female patient presents for a cataract evaluation, referred by Dr. Daisy Lazar. 1. The patient complains of difficulty when reading fine labels(e.g. medication), which began for an unknown amount of time. Both eyes are affected. The episode is gradual. The condition is worse with intensive visual activity (reading, computer). This is negatively affecting the patient's quality of life and the patient is unable to function adequately in life with the current level of vision. The patient experiences no eye pain and no flashes, floater, shadow, curtain or veil. HPI was performed by Fabio Pierce .  Medical History: Dry Eyes Glaucoma ERM - OD PVD OU  Arthritis Heart Problem  Review of Systems Cardiovascular Atrial fib Musculoskeletal Joint Ache, Pain All recorded systems are negative except as noted above.  Social Never smoked   Medication Refresh Celluvisc,  Atorvastatin, Eliquis, Metoprolol succinate, Flecainide  Sx/Procedures None  Drug Allergies  NKDA  History & Physical: Heent: cataracts NECK: supple without bruits LUNGS: lungs clear to auscultation CV: regular rate and rhythm Abdomen: soft and non-tender  Impression & Plan: Assessment: 1.  COMBINED FORMS AGE RELATED CATARACT; Both Eyes (H25.813) 2.  BLEPHARITIS; Right Upper Lid, Right Lower Lid, Left Upper Lid, Left Lower Lid (H01.001, H01.002,H01.004,H01.005) 3.  VITREOUS DETACHMENT PVD; Both Eyes (H43.813) 4.  MACULAR PUCKER; Both Eyes (H35.373) 5.  OAG BORDERLINE FINDINGS LOW RISK; Both Eyes (H40.013)  Plan: 1.  Cataract accounts for the patient's decreased vision. This visual impairment is not correctable with a tolerable change in glasses or contact  lenses. Cataract surgery with an implantation of a new lens should significantly improve the visual and functional status of the patient. Discussed all risks, benefits, alternatives, and potential complications. Discussed the procedures and recovery. Patient desires to have surgery. A-scan ordered and performed today for intra-ocular lens calculations. The surgery will be performed in order to improve vision for driving, reading, and for eye examinations. Recommend phacoemulsification with intra-ocular lens. Recommend Dextenza for post-operative pain and inflammation. Right Eye worse - first. Dilates well - shugarcaine by protocol.  2.  Blepharitis is present - recommend regular lid cleaning.  3.  Old Asymptomatic. RD precautions given. Patient to call with increase in flashing lights/floaters/dark curtain. Symptomatic.  4.  New. Findings, prognosis and treatment options reviewed. Monitor.  5.  Based on cup-to-disc ratio. Negative Family history. OCT rNFL shows: WNL OU 07/29/23 Detailed discussion about glaucoma today including importance of maintaining good follow up and following treatment plan, and the possibility of irreversible blindness as part of this disease process.

## 2023-10-03 NOTE — Anesthesia Preprocedure Evaluation (Addendum)
Anesthesia Evaluation  Patient identified by MRN, date of birth, ID band Patient awake    Reviewed: Allergy & Precautions, H&P , NPO status , Patient's Chart, lab work & pertinent test results, reviewed documented beta blocker date and time   Airway Mallampati: II  TM Distance: >3 FB Neck ROM: full    Dental no notable dental hx. (+) Dental Advisory Given   Pulmonary former smoker   Pulmonary exam normal breath sounds clear to auscultation       Cardiovascular Normal cardiovascular exam+ dysrhythmias Atrial Fibrillation + Valvular Problems/Murmurs  Rhythm:Regular Rate:Normal     Neuro/Psych negative neurological ROS  negative psych ROS   GI/Hepatic negative GI ROS, Neg liver ROS,,,  Endo/Other  negative endocrine ROS    Renal/GU negative Renal ROS  negative genitourinary   Musculoskeletal   Abdominal   Peds  Hematology negative hematology ROS (+)   Anesthesia Other Findings   Reproductive/Obstetrics negative OB ROS                             Anesthesia Physical Anesthesia Plan  ASA: 3  Anesthesia Plan: MAC   Post-op Pain Management: Minimal or no pain anticipated   Induction:   PONV Risk Score and Plan:   Airway Management Planned: Nasal Cannula and Natural Airway  Additional Equipment:   Intra-op Plan:   Post-operative Plan:   Informed Consent: I have reviewed the patients History and Physical, chart, labs and discussed the procedure including the risks, benefits and alternatives for the proposed anesthesia with the patient or authorized representative who has indicated his/her understanding and acceptance.     Dental Advisory Given  Plan Discussed with: CRNA  Anesthesia Plan Comments:        Anesthesia Quick Evaluation

## 2023-10-04 ENCOUNTER — Ambulatory Visit (HOSPITAL_COMMUNITY): Payer: Medicare Other | Admitting: Anesthesiology

## 2023-10-04 ENCOUNTER — Encounter (HOSPITAL_COMMUNITY)
Admission: RE | Disposition: A | Discharge status: Home / Self Care | Payer: Self-pay | Source: Home / Self Care | Attending: Ophthalmology

## 2023-10-04 ENCOUNTER — Other Ambulatory Visit: Payer: Self-pay

## 2023-10-04 ENCOUNTER — Ambulatory Visit (HOSPITAL_COMMUNITY)
Admission: RE | Admit: 2023-10-04 | Discharge: 2023-10-04 | Disposition: A | Discharge status: Home / Self Care | Payer: Medicare Other | Attending: Ophthalmology | Admitting: Ophthalmology

## 2023-10-04 ENCOUNTER — Ambulatory Visit (HOSPITAL_BASED_OUTPATIENT_CLINIC_OR_DEPARTMENT_OTHER): Payer: Medicare Other | Admitting: Anesthesiology

## 2023-10-04 ENCOUNTER — Encounter (HOSPITAL_COMMUNITY): Payer: Self-pay | Admitting: Ophthalmology

## 2023-10-04 DIAGNOSIS — H25813 Combined forms of age-related cataract, bilateral: Secondary | ICD-10-CM | POA: Diagnosis not present

## 2023-10-04 DIAGNOSIS — H0100B Unspecified blepharitis left eye, upper and lower eyelids: Secondary | ICD-10-CM | POA: Insufficient documentation

## 2023-10-04 DIAGNOSIS — H43813 Vitreous degeneration, bilateral: Secondary | ICD-10-CM | POA: Diagnosis not present

## 2023-10-04 DIAGNOSIS — I4891 Unspecified atrial fibrillation: Secondary | ICD-10-CM

## 2023-10-04 DIAGNOSIS — H0100A Unspecified blepharitis right eye, upper and lower eyelids: Secondary | ICD-10-CM | POA: Insufficient documentation

## 2023-10-04 DIAGNOSIS — H40013 Open angle with borderline findings, low risk, bilateral: Secondary | ICD-10-CM | POA: Diagnosis not present

## 2023-10-04 DIAGNOSIS — H25811 Combined forms of age-related cataract, right eye: Secondary | ICD-10-CM

## 2023-10-04 DIAGNOSIS — H35373 Puckering of macula, bilateral: Secondary | ICD-10-CM | POA: Insufficient documentation

## 2023-10-04 DIAGNOSIS — Z87891 Personal history of nicotine dependence: Secondary | ICD-10-CM | POA: Diagnosis not present

## 2023-10-04 HISTORY — PX: CATARACT EXTRACTION W/PHACO: SHX586

## 2023-10-04 SURGERY — PHACOEMULSIFICATION, CATARACT, WITH IOL INSERTION
Anesthesia: Monitor Anesthesia Care | Site: Eye | Laterality: Right

## 2023-10-04 MED ORDER — MIDAZOLAM HCL 2 MG/2ML IJ SOLN
INTRAMUSCULAR | Status: AC
  Filled 2023-10-04: qty 2

## 2023-10-04 MED ORDER — LIDOCAINE HCL (PF) 1 % IJ SOLN
INTRAOCULAR | Status: DC | PRN
  Administered 2023-10-04: 1 mL via OPHTHALMIC

## 2023-10-04 MED ORDER — TROPICAMIDE 1 % OP SOLN
1.0000 [drp] | OPHTHALMIC | Status: AC | PRN
  Administered 2023-10-04 (×3): 1 [drp] via OPHTHALMIC

## 2023-10-04 MED ORDER — POVIDONE-IODINE 5 % OP SOLN
OPHTHALMIC | Status: DC | PRN
  Administered 2023-10-04: 1 via OPHTHALMIC

## 2023-10-04 MED ORDER — STERILE WATER FOR IRRIGATION IR SOLN
Status: DC | PRN
  Administered 2023-10-04: 250 mL

## 2023-10-04 MED ORDER — PHENYLEPHRINE HCL 2.5 % OP SOLN
1.0000 [drp] | OPHTHALMIC | Status: AC | PRN
  Administered 2023-10-04 (×3): 1 [drp] via OPHTHALMIC

## 2023-10-04 MED ORDER — SODIUM HYALURONATE 23MG/ML IO SOSY
PREFILLED_SYRINGE | INTRAOCULAR | Status: DC | PRN
  Administered 2023-10-04: .6 mL via INTRAOCULAR

## 2023-10-04 MED ORDER — LIDOCAINE HCL 3.5 % OP GEL: 1.0000 | Freq: Once | OPHTHALMIC | Status: DC

## 2023-10-04 MED ORDER — SODIUM HYALURONATE 10 MG/ML IO SOLUTION
PREFILLED_SYRINGE | INTRAOCULAR | Status: DC | PRN
  Administered 2023-10-04: .85 mL via INTRAOCULAR

## 2023-10-04 MED ORDER — EPINEPHRINE PF 1 MG/ML IJ SOLN
INTRAOCULAR | Status: DC | PRN
  Administered 2023-10-04: 500 mL

## 2023-10-04 MED ORDER — TETRACAINE HCL 0.5 % OP SOLN
1.0000 [drp] | OPHTHALMIC | Status: AC | PRN
  Administered 2023-10-04 (×3): 1 [drp] via OPHTHALMIC

## 2023-10-04 MED ORDER — MOXIFLOXACIN HCL 5 MG/ML IO SOLN
INTRAOCULAR | Status: DC | PRN
  Administered 2023-10-04: .3 mL via OPHTHALMIC

## 2023-10-04 MED ORDER — BSS IO SOLN
INTRAOCULAR | Status: DC | PRN
  Administered 2023-10-04: 15 mL via INTRAOCULAR

## 2023-10-04 MED ORDER — LACTATED RINGERS IV SOLN: INTRAVENOUS | Status: DC

## 2023-10-04 SURGICAL SUPPLY — 14 items
CATARACT SUITE SIGHTPATH (MISCELLANEOUS) ×1
CLOTH BEACON ORANGE TIMEOUT ST (SAFETY) ×1 IMPLANT
EYE SHIELD UNIVERSAL CLEAR (GAUZE/BANDAGES/DRESSINGS) IMPLANT
FEE CATARACT SUITE SIGHTPATH (MISCELLANEOUS) ×1 IMPLANT
GLOVE BIOGEL PI IND STRL 7.0 (GLOVE) ×2 IMPLANT
LENS IOL TECNIS EYHANCE 23.0 (Intraocular Lens) IMPLANT
NDL HYPO 18GX1.5 BLUNT FILL (NEEDLE) ×1 IMPLANT
NEEDLE HYPO 18GX1.5 BLUNT FILL (NEEDLE) ×1
PAD ARMBOARD 7.5X6 YLW CONV (MISCELLANEOUS) ×1 IMPLANT
POSITIONER HEAD 8X9X4 ADT (SOFTGOODS) ×1 IMPLANT
RING MALYGIN 7.0 (MISCELLANEOUS) IMPLANT
SYR TB 1ML LL NO SAFETY (SYRINGE) ×1 IMPLANT
TAPE SURG TRANSPORE 1 IN (GAUZE/BANDAGES/DRESSINGS) IMPLANT
WATER STERILE IRR 250ML POUR (IV SOLUTION) ×1 IMPLANT

## 2023-10-04 NOTE — Discharge Instructions (Signed)
Please discharge patient when stable, will follow up today with Dr. Carnella Fryman at the [facility] office immediately following discharge.  Leave shield in place until visit.  All paperwork with discharge instructions will be given at the office.  [facility] Address:  [address]  [place], [place] [address]  

## 2023-10-04 NOTE — Interval H&P Note (Signed)
History and Physical Interval Note:  10/04/2023 7:45 AM  Gloria Stewart  has presented today for surgery, with the diagnosis of combined age related cataract, right eye.  The various methods of treatment have been discussed with the patient and family. After consideration of risks, benefits and other options for treatment, the patient has consented to  Procedure(s): CATARACT EXTRACTION PHACO AND INTRAOCULAR LENS PLACEMENT (IOC) (Right) as a surgical intervention.  The patient's history has been reviewed, patient examined, no change in status, stable for surgery.  I have reviewed the patient's chart and labs.  Questions were answered to the patient's satisfaction.     Fabio Pierce

## 2023-10-04 NOTE — Anesthesia Postprocedure Evaluation (Signed)
Anesthesia Post Note  Patient: Gloria Stewart  Procedure(s) Performed: CATARACT EXTRACTION PHACO AND INTRAOCULAR LENS PLACEMENT (IOC) (Right: Eye)  Patient location during evaluation: PACU Anesthesia Type: MAC Level of consciousness: awake and alert Pain management: pain level controlled Vital Signs Assessment: post-procedure vital signs reviewed and stable Respiratory status: spontaneous breathing, nonlabored ventilation, respiratory function stable and patient connected to nasal cannula oxygen Cardiovascular status: blood pressure returned to baseline and stable Postop Assessment: no apparent nausea or vomiting Anesthetic complications: no   There were no known notable events for this encounter.   Last Vitals:  Vitals:   10/04/23 0641 10/04/23 0812  BP: (!) 112/58 (!) 114/53  Pulse: (!) 47 (!) 51  Resp: 12 12  Temp: 36.6 C 36.6 C  SpO2: 100% 100%    Last Pain:  Vitals:   10/04/23 0812  TempSrc: Oral  PainSc: 0-No pain                 Breona Cherubin L Lewanda Perea

## 2023-10-04 NOTE — Op Note (Signed)
Date of procedure: 10/04/23  Pre-operative diagnosis:  Visually significant combined form age-related cataract, Right Eye (H25.811)  Post-operative diagnosis:  Visually significant combined form age-related cataract, Right Eye (H25.811)  Procedure: Removal of cataract via phacoemulsification and insertion of intra-ocular lens [employer] DIB00 +23.0D into the capsular bag of the Right Eye  Attending surgeon: Rudy Jew. Dezi Schaner, MD, MA  Anesthesia: MAC, Topical Akten  Complications: None  Estimated Blood Loss: <77mL (minimal)  Specimens: None  Implants: As above  Indications:  Visually significant age-related cataract, Right Eye  Procedure:  The patient was seen and identified in the pre-operative area. The operative eye was identified and dilated.  The operative eye was marked.  Topical anesthesia was administered to the operative eye.     The patient was then to the operative suite and placed in the supine position.  A timeout was performed confirming the patient, procedure to be performed, and all other relevant information.   The patient's face was prepped and draped in the usual fashion for intra-ocular surgery.  A lid speculum was placed into the operative eye and the surgical microscope moved into place and focused.  A superotemporal paracentesis was created using a 20 gauge paracentesis blade.  Shugarcaine was injected into the anterior chamber.  Viscoelastic was injected into the anterior chamber.  A temporal clear-corneal main wound incision was created using a 2.95mm microkeratome.  A continuous curvilinear capsulorrhexis was initiated using an irrigating cystitome and completed using capsulorrhexis forceps.  Hydrodissection and hydrodeliniation were performed.  Viscoelastic was injected into the anterior chamber.  A phacoemulsification handpiece and a chopper as a second instrument were used to remove the nucleus and epinucleus. The irrigation/aspiration handpiece was used to  remove any remaining cortical material.   The capsular bag was reinflated with viscoelastic, checked, and found to be intact.  The intraocular lens was inserted into the capsular bag.  The irrigation/aspiration handpiece was used to remove any remaining viscoelastic.  The clear corneal wound and paracentesis wounds were then hydrated and checked with Weck-Cels to be watertight. 0.48mL of Moxfloxacin was injected into the anterior chamber. The lid-speculum was removed.  The drape was removed.  The patient's face was cleaned with a wet and dry 4x4. A clear shield was taped over the eye. The patient was taken to the post-operative care unit in good condition, having tolerated the procedure well.  Post-Op Instructions: The patient will follow up at [facility] for a same day post-operative evaluation and will receive all other orders and instructions.

## 2023-10-04 NOTE — Anesthesia Procedure Notes (Signed)
Procedure Name: MAC Date/Time: 10/04/2023 7:52 AM  Performed by: Julian Reil, CRNAPre-anesthesia Checklist: Patient identified, Emergency Drugs available, Suction available and Patient being monitored Patient Re-evaluated:Patient Re-evaluated prior to induction Oxygen Delivery Method: Nasal cannula Placement Confirmation: positive ETCO2

## 2023-10-04 NOTE — Addendum Note (Signed)
Addendum  created 10/04/23 [id number] by Ronelle Nigh, MD   Clinical Note Signed

## 2023-10-04 NOTE — Anesthesia Postprocedure Evaluation (Signed)
Anesthesia Post Note  Patient: Gloria Stewart  Procedure(s) Performed: CATARACT EXTRACTION PHACO AND INTRAOCULAR LENS PLACEMENT (IOC) (Right: Eye)  Anesthesia Type: MAC Anesthetic complications: no   There were no known notable events for this encounter.   Last Vitals:  Vitals:   10/04/23 0641 10/04/23 0812  BP: (!) 112/58 (!) 114/53  Pulse: (!) 47 (!) 51  Resp: 12 12  Temp: 36.6 C 36.6 C  SpO2: 100% 100%    Last Pain:  Vitals:   10/04/23 0812  TempSrc: Oral  PainSc: 0-No pain                 Donyetta Ogletree L Ripley Lovecchio

## 2023-10-04 NOTE — Transfer of Care (Signed)
Immediate Anesthesia Transfer of Care Note  Patient: Gloria Stewart  Procedure(s) Performed: CATARACT EXTRACTION PHACO AND INTRAOCULAR LENS PLACEMENT (IOC) (Right: Eye)  Patient Location: Short Stay  Anesthesia Type:MAC  Level of Consciousness: awake, alert , and oriented  Airway & Oxygen Therapy: Patient Spontanous Breathing  Post-op Assessment: Report given to RN and Post -op Vital signs reviewed and stable  Post vital signs: Reviewed and stable  Last Vitals:  Vitals Value Taken Time  BP    Temp    Pulse    Resp    SpO2      Last Pain:  Vitals:   10/04/23 0641  TempSrc: Oral  PainSc: 0-No pain         Complications: No notable events documented.

## 2023-10-07 DIAGNOSIS — H25812 Combined forms of age-related cataract, left eye: Secondary | ICD-10-CM | POA: Diagnosis not present

## 2023-10-08 ENCOUNTER — Encounter (HOSPITAL_COMMUNITY): Payer: Self-pay | Admitting: Ophthalmology

## 2023-10-09 ENCOUNTER — Encounter (HOSPITAL_COMMUNITY)
Admission: RE | Admit: 2023-10-09 | Discharge: 2023-10-09 | Disposition: A | Discharge status: Home / Self Care | Payer: Medicare Other | Source: Ambulatory Visit | Attending: Ophthalmology | Admitting: Ophthalmology

## 2023-10-09 ENCOUNTER — Encounter (HOSPITAL_COMMUNITY): Payer: Self-pay

## 2023-10-10 NOTE — H&P (Signed)
Surgical History & Physical  Patient Name: Gloria Stewart  DOB: 02-Mar-1951  Surgery: Cataract extraction with intraocular lens implant phacoemulsification; Left Eye Surgeon: Fabio Pierce MD Surgery Date: 10/14/2023 Pre-Op Date: 10/10/2023  HPI: A 76 Yr. old female patient 1. The patient is returning after cataract surgery. The right eye is affected. Status post cataract surgery, which began 1 weeks ago: Since the last visit, the affected area has no changes noted. The patient's vision is stable. The patient states that "her expectations surpassed the results" and does not noticed a significant improvement in her vision Patient is following medication instructions. The patient experiences no eye pain and no flashes, floater, shadow, curtain or veil. Patient reports that she has difficulty with reading small print OS. Patient states that her vision OS is blurred and hazy. Patient states that she is bothered by the glare she receives from the sunlight. Patient has difficulty with seeing the steps and reading television captioning as well. Patient elects to proceed with cataract sx OS. HPI was performed by Fabio Pierce .  Medical History: Dry Eyes ERM - OD PVD OU  Arthritis Heart Problem  Review of Systems Cardiovascular Atrial fib Musculoskeletal Joint Ache, Pain All recorded systems are negative except as noted above.  Social Never smoked  Medication Refresh Celluvisc, Prednisolone-moxiflox-bromfen,  Atorvastatin, Eliquis, Metoprolol succinate, Flecainide  Sx/Procedures Phaco c IOL OD   Drug Allergies  NKDA  History & Physical: Heent: cataract NECK: supple without bruits LUNGS: lungs clear to auscultation CV: regular rate and rhythm Abdomen: soft and non-tender  Impression & Plan: Assessment: 1.  CATARACT EXTRACTION STATUS; Right Eye (Z98.41) 2.  COMBINED FORMS AGE RELATED CATARACT; Left Eye (H25.812) 3.  INTRAOCULAR LENS IOL ; Right Eye (Z96.1)  Plan: 1.  6 days  after cataract surgery. Doing well with improved vision and normal eye pressure. Call with any problems or concerns. Continue Pred-Moxi-Brom 3x/day for 1 more day and then 2x/day for 3 more weeks.  2.  Cataract accounts for the patient's decreased vision. This visual impairment is not correctable with a tolerable change in glasses or contact lenses. Cataract surgery with an implantation of a new lens should significantly improve the visual and functional status of the patient. Discussed all risks, benefits, alternatives, and potential complications. Discussed the procedures and recovery. Patient desires to have surgery. A-scan ordered and performed today for intra-ocular lens calculations. The surgery will be performed in order to improve vision for driving, reading, and for eye examinations. Recommend phacoemulsification with intra-ocular lens. Recommend Dextenza for post-operative pain and inflammation. Left Eye. Surgery required to correct imbalance of vision. Dilates well - shugarcaine by protocol.  3.  Doing well since surgery Continue Post-op medications

## 2023-10-14 ENCOUNTER — Other Ambulatory Visit: Payer: Self-pay

## 2023-10-14 ENCOUNTER — Ambulatory Visit (HOSPITAL_COMMUNITY): Payer: Medicare Other | Admitting: Anesthesiology

## 2023-10-14 ENCOUNTER — Encounter (HOSPITAL_COMMUNITY)
Admission: RE | Disposition: A | Discharge status: Home / Self Care | Payer: Self-pay | Source: Home / Self Care | Attending: Ophthalmology

## 2023-10-14 ENCOUNTER — Ambulatory Visit (HOSPITAL_COMMUNITY)
Admission: RE | Admit: 2023-10-14 | Discharge: 2023-10-14 | Disposition: A | Discharge status: Home / Self Care | Payer: Medicare Other | Attending: Ophthalmology | Admitting: Ophthalmology

## 2023-10-14 ENCOUNTER — Encounter (HOSPITAL_COMMUNITY): Payer: Self-pay | Admitting: Ophthalmology

## 2023-10-14 DIAGNOSIS — H25812 Combined forms of age-related cataract, left eye: Secondary | ICD-10-CM | POA: Diagnosis not present

## 2023-10-14 DIAGNOSIS — Z961 Presence of intraocular lens: Secondary | ICD-10-CM | POA: Insufficient documentation

## 2023-10-14 DIAGNOSIS — I4891 Unspecified atrial fibrillation: Secondary | ICD-10-CM | POA: Diagnosis not present

## 2023-10-14 DIAGNOSIS — Z9841 Cataract extraction status, right eye: Secondary | ICD-10-CM | POA: Diagnosis not present

## 2023-10-14 HISTORY — PX: CATARACT EXTRACTION W/PHACO: SHX586

## 2023-10-14 SURGERY — PHACOEMULSIFICATION, CATARACT, WITH IOL INSERTION
Anesthesia: Monitor Anesthesia Care | Site: Eye | Laterality: Left

## 2023-10-14 MED ORDER — SODIUM HYALURONATE 10 MG/ML IO SOLUTION
PREFILLED_SYRINGE | INTRAOCULAR | Status: DC | PRN
  Administered 2023-10-14: .85 mL via INTRAOCULAR

## 2023-10-14 MED ORDER — LIDOCAINE HCL 3.5 % OP GEL
1.0000 | Freq: Once | OPHTHALMIC | Status: AC
  Administered 2023-10-14: 1 via OPHTHALMIC

## 2023-10-14 MED ORDER — MIDAZOLAM HCL 2 MG/2ML IJ SOLN
INTRAMUSCULAR | Status: DC | PRN
  Administered 2023-10-14: 2 mg via INTRAVENOUS

## 2023-10-14 MED ORDER — STERILE WATER FOR IRRIGATION IR SOLN
Status: DC | PRN
  Administered 2023-10-14: 250 mL

## 2023-10-14 MED ORDER — PHENYLEPHRINE HCL 2.5 % OP SOLN
1.0000 [drp] | OPHTHALMIC | Status: AC | PRN
  Administered 2023-10-14 (×3): 1 [drp] via OPHTHALMIC

## 2023-10-14 MED ORDER — EPINEPHRINE PF 1 MG/ML IJ SOLN
INTRAOCULAR | Status: DC | PRN
  Administered 2023-10-14: 500 mL

## 2023-10-14 MED ORDER — SODIUM HYALURONATE 23MG/ML IO SOSY
PREFILLED_SYRINGE | INTRAOCULAR | Status: DC | PRN
  Administered 2023-10-14: .6 mL via INTRAOCULAR

## 2023-10-14 MED ORDER — EPINEPHRINE PF 1 MG/ML IJ SOLN
INTRAMUSCULAR | Status: AC
  Filled 2023-10-14: qty 1

## 2023-10-14 MED ORDER — LIDOCAINE HCL (PF) 1 % IJ SOLN
INTRAOCULAR | Status: DC | PRN
  Administered 2023-10-14: 1 mL via OPHTHALMIC

## 2023-10-14 MED ORDER — POVIDONE-IODINE 5 % OP SOLN
OPHTHALMIC | Status: DC | PRN
  Administered 2023-10-14: 1 via OPHTHALMIC

## 2023-10-14 MED ORDER — BSS IO SOLN
INTRAOCULAR | Status: DC | PRN
  Administered 2023-10-14: 15 mL via INTRAOCULAR

## 2023-10-14 MED ORDER — TETRACAINE HCL 0.5 % OP SOLN
1.0000 [drp] | OPHTHALMIC | Status: AC | PRN
  Administered 2023-10-14 (×3): 1 [drp] via OPHTHALMIC

## 2023-10-14 MED ORDER — SODIUM CHLORIDE 0.9% FLUSH
INTRAVENOUS | Status: DC | PRN
  Administered 2023-10-14: 10 mL via INTRAVENOUS

## 2023-10-14 MED ORDER — MIDAZOLAM HCL 2 MG/2ML IJ SOLN
INTRAMUSCULAR | Status: AC
  Filled 2023-10-14: qty 2

## 2023-10-14 MED ORDER — MOXIFLOXACIN HCL 5 MG/ML IO SOLN
INTRAOCULAR | Status: DC | PRN
  Administered 2023-10-14: .2 mL

## 2023-10-14 MED ORDER — TROPICAMIDE 1 % OP SOLN
1.0000 [drp] | OPHTHALMIC | Status: AC | PRN
  Administered 2023-10-14 (×3): 1 [drp] via OPHTHALMIC

## 2023-10-14 SURGICAL SUPPLY — 14 items
CATARACT SUITE SIGHTPATH (MISCELLANEOUS) ×1
CLOTH BEACON ORANGE TIMEOUT ST (SAFETY) ×1 IMPLANT
EYE SHIELD UNIVERSAL CLEAR (GAUZE/BANDAGES/DRESSINGS) IMPLANT
FEE CATARACT SUITE SIGHTPATH (MISCELLANEOUS) ×1 IMPLANT
GLOVE BIOGEL PI IND STRL 7.0 (GLOVE) ×2 IMPLANT
LENS IOL TECNIS EYHANCE 22.5 (Intraocular Lens) IMPLANT
NDL HYPO 18GX1.5 BLUNT FILL (NEEDLE) ×1 IMPLANT
NEEDLE HYPO 18GX1.5 BLUNT FILL (NEEDLE) ×1
PAD ARMBOARD 7.5X6 YLW CONV (MISCELLANEOUS) ×1 IMPLANT
POSITIONER HEAD 8X9X4 ADT (SOFTGOODS) ×1 IMPLANT
RING MALYGIN 7.0 (MISCELLANEOUS) IMPLANT
SYR TB 1ML LL NO SAFETY (SYRINGE) ×1 IMPLANT
TAPE SURG TRANSPORE 1 IN (GAUZE/BANDAGES/DRESSINGS) IMPLANT
WATER STERILE IRR 250ML POUR (IV SOLUTION) ×1 IMPLANT

## 2023-10-14 NOTE — Interval H&P Note (Signed)
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  The various methods of treatment have been discussed with the patient and family. After consideration of risks, benefits and other options for treatment, the patient has consented to  Procedure(s) with comments: CATARACT EXTRACTION PHACO AND INTRAOCULAR LENS PLACEMENT (IOC) (Left) - CDE: as a surgical intervention.  The patient's history has been reviewed, patient examined, no change in status, stable for surgery.  I have reviewed the patient's chart and labs.  Questions were answered to the patient's satisfaction.     Fabio Pierce

## 2023-10-14 NOTE — Op Note (Signed)
Date of procedure: 10/14/23  Pre-operative diagnosis: Visually significant age-related combined cataract, Left Eye (H25.812)  Post-operative diagnosis: Visually significant age-related combined cataract, Left Eye (H25.812)  Procedure: Removal of cataract via phacoemulsification and insertion of intra-ocular lens Laural Benes and Johnson DIB00 +22.5D into the capsular bag of the Left Eye  Attending surgeon: Rudy Jew. Patrick Sohm, MD, MA  Anesthesia: MAC, Topical Akten  Complications: None  Estimated Blood Loss: <91mL (minimal)  Specimens: None  Implants: As above  Indications:  Visually significant age-related cataract, Left Eye  Procedure:  The patient was seen and identified in the pre-operative area. The operative eye was identified and dilated.  The operative eye was marked.  Topical anesthesia was administered to the operative eye.     The patient was then to the operative suite and placed in the supine position.  A timeout was performed confirming the patient, procedure to be performed, and all other relevant information.   The patient's face was prepped and draped in the usual fashion for intra-ocular surgery.  A lid speculum was placed into the operative eye and the surgical microscope moved into place and focused.  An inferotemporal paracentesis was created using a 20 gauge paracentesis blade.  Shugarcaine was injected into the anterior chamber.  Viscoelastic was injected into the anterior chamber.  A temporal clear-corneal main wound incision was created using a 2.16mm microkeratome.  A continuous curvilinear capsulorrhexis was initiated using an irrigating cystitome and completed using capsulorrhexis forceps.  Hydrodissection and hydrodeliniation were performed.  Viscoelastic was injected into the anterior chamber.  A phacoemulsification handpiece and a chopper as a second instrument were used to remove the nucleus and epinucleus. The irrigation/aspiration handpiece was used to remove any  remaining cortical material.   The capsular bag was reinflated with viscoelastic, checked, and found to be intact.  The intraocular lens was inserted into the capsular bag.  The irrigation/aspiration handpiece was used to remove any remaining viscoelastic.  The clear corneal wound and paracentesis wounds were then hydrated and checked with Weck-Cels to be watertight. 0.28mL of Moxfloxacin was injected into the anterior chamber. The lid-speculum was removed.  The drape was removed.  The patient's face was cleaned with a wet and dry 4x4.    A clear shield was taped over the eye. The patient was taken to the post-operative care unit in good condition, having tolerated the procedure well.  Post-Op Instructions: The patient will follow up at [facility] for a same day post-operative evaluation and will receive all other orders and instructions.

## 2023-10-14 NOTE — Transfer of Care (Signed)
Immediate Anesthesia Transfer of Care Note  Patient: Gloria Stewart  Procedure(s) Performed: CATARACT EXTRACTION PHACO AND INTRAOCULAR LENS PLACEMENT (IOC) (Left: Eye)  Patient Location: Short Stay  Anesthesia Type:MAC  Level of Consciousness: awake, alert , oriented, and patient cooperative  Airway & Oxygen Therapy: Patient Spontanous Breathing  Post-op Assessment: Report given to RN, Post -op Vital signs reviewed and stable, and Patient moving all extremities X 4  Post vital signs: Reviewed and stable  Last Vitals:  Vitals Value Taken Time  BP 111/69 10/14/23 1412  Temp 36.4 C 10/14/23 1412  Pulse 52 10/14/23 1412  Resp 11 10/14/23 1412  SpO2 100 % 10/14/23 1412    Last Pain:  Vitals:   10/14/23 1412  TempSrc: Oral  PainSc: 0-No pain         Complications: No notable events documented.

## 2023-10-14 NOTE — Anesthesia Preprocedure Evaluation (Signed)
Anesthesia Evaluation  Patient identified by MRN, date of birth, ID band Patient awake    Reviewed: Allergy & Precautions, H&P , NPO status , Patient's Chart, lab work & pertinent test results, reviewed documented beta blocker date and time   Airway Mallampati: II  TM Distance: >3 FB Neck ROM: full    Dental no notable dental hx. (+) Dental Advisory Given   Pulmonary former smoker   Pulmonary exam normal breath sounds clear to auscultation       Cardiovascular Normal cardiovascular exam+ dysrhythmias Atrial Fibrillation + Valvular Problems/Murmurs  Rhythm:Regular Rate:Normal     Neuro/Psych negative neurological ROS  negative psych ROS   GI/Hepatic negative GI ROS, Neg liver ROS,,,  Endo/Other  negative endocrine ROS    Renal/GU negative Renal ROS  negative genitourinary   Musculoskeletal   Abdominal   Peds  Hematology negative hematology ROS (+)   Anesthesia Other Findings   Reproductive/Obstetrics negative OB ROS                              Anesthesia Physical Anesthesia Plan  ASA: 3  Anesthesia Plan: MAC   Post-op Pain Management: Minimal or no pain anticipated   Induction:   PONV Risk Score and Plan:   Airway Management Planned: Nasal Cannula and Natural Airway  Additional Equipment:   Intra-op Plan:   Post-operative Plan:   Informed Consent: I have reviewed the patients History and Physical, chart, labs and discussed the procedure including the risks, benefits and alternatives for the proposed anesthesia with the patient or authorized representative who has indicated his/her understanding and acceptance.     Dental Advisory Given  Plan Discussed with: CRNA  Anesthesia Plan Comments:         Anesthesia Quick Evaluation

## 2023-10-16 ENCOUNTER — Encounter (HOSPITAL_COMMUNITY): Payer: Self-pay | Admitting: Ophthalmology

## 2023-10-20 NOTE — Anesthesia Postprocedure Evaluation (Signed)
Anesthesia Post Note  Patient: Gloria Stewart  Procedure(s) Performed: CATARACT EXTRACTION PHACO AND INTRAOCULAR LENS PLACEMENT (IOC) (Left: Eye)  Patient location during evaluation: Phase II Anesthesia Type: MAC Level of consciousness: awake Pain management: pain level controlled Vital Signs Assessment: post-procedure vital signs reviewed and stable Respiratory status: spontaneous breathing and respiratory function stable Cardiovascular status: blood pressure returned to baseline and stable Postop Assessment: no headache and no apparent nausea or vomiting Anesthetic complications: no Comments: Late entry   No notable events documented.   Last Vitals:  Vitals:   10/14/23 1229 10/14/23 1412  BP: 121/74 111/69  Pulse: (!) 48 (!) 52  Resp: 14 11  Temp: 36.4 C (!) 36.4 C  SpO2: 100% 100%    Last Pain:  Vitals:   10/14/23 1412  TempSrc: Oral  PainSc: 0-No pain                 Windell Norfolk

## 2023-10-29 ENCOUNTER — Encounter (HOSPITAL_COMMUNITY): Payer: Medicare Other

## 2023-11-05 ENCOUNTER — Other Ambulatory Visit: Payer: Self-pay | Admitting: Cardiology

## 2023-11-05 NOTE — Telephone Encounter (Signed)
Prescription refill request for Eliquis received. Indication: PAF Last office visit: 05/20/23  Simona Huh MD Scr: 0.84 on 05/01/23  Epic Age: 72 Weight: 65.7kg  Based on above findings Eliquis 5mg  twice daily is the appropriate dose.  Refill approved.

## 2023-11-08 ENCOUNTER — Other Ambulatory Visit (HOSPITAL_COMMUNITY): Payer: Self-pay | Admitting: Internal Medicine

## 2023-11-08 DIAGNOSIS — Z1231 Encounter for screening mammogram for malignant neoplasm of breast: Secondary | ICD-10-CM

## 2023-11-12 DIAGNOSIS — E559 Vitamin D deficiency, unspecified: Secondary | ICD-10-CM | POA: Diagnosis not present

## 2023-11-12 DIAGNOSIS — E782 Mixed hyperlipidemia: Secondary | ICD-10-CM | POA: Diagnosis not present

## 2023-11-15 ENCOUNTER — Encounter (HOSPITAL_COMMUNITY): Payer: Medicare Other | Attending: Internal Medicine | Admitting: *Deleted

## 2023-11-15 ENCOUNTER — Encounter (HOSPITAL_COMMUNITY): Admission: RE | Admit: 2023-11-15 | Payer: Medicare Other | Source: Ambulatory Visit

## 2023-11-15 VITALS — BP 108/50 | HR 127 | Temp 98.3°F | Resp 18

## 2023-11-15 DIAGNOSIS — M810 Age-related osteoporosis without current pathological fracture: Secondary | ICD-10-CM | POA: Diagnosis not present

## 2023-11-15 MED ORDER — DENOSUMAB 60 MG/ML ~~LOC~~ SOSY
60.0000 mg | PREFILLED_SYRINGE | Freq: Once | SUBCUTANEOUS | Status: AC
  Administered 2023-11-15: 60 mg via SUBCUTANEOUS

## 2023-11-15 NOTE — Progress Notes (Signed)
 Diagnosis: Osteoporosis  Provider:  Dwana Melena MD  Procedure: Injection  Prolia (Denosumab), Dose: 60 mg, Site: subcutaneous, Number of injections: 1  Post Care: Observation period completed  Discharge: Condition: Good, Destination: Home . AVS Provided  Performed by:  Daleen Squibb, RN

## 2023-11-18 ENCOUNTER — Encounter: Payer: Self-pay | Admitting: Internal Medicine

## 2023-11-18 ENCOUNTER — Ambulatory Visit (HOSPITAL_COMMUNITY)
Admission: RE | Admit: 2023-11-18 | Discharge: 2023-11-18 | Disposition: A | Discharge status: Home / Self Care | Payer: Medicare Other | Source: Ambulatory Visit | Attending: Internal Medicine | Admitting: Internal Medicine

## 2023-11-18 DIAGNOSIS — E782 Mixed hyperlipidemia: Secondary | ICD-10-CM | POA: Diagnosis not present

## 2023-11-18 DIAGNOSIS — E559 Vitamin D deficiency, unspecified: Secondary | ICD-10-CM | POA: Diagnosis not present

## 2023-11-18 DIAGNOSIS — R059 Cough, unspecified: Secondary | ICD-10-CM | POA: Diagnosis not present

## 2023-11-18 DIAGNOSIS — Z7901 Long term (current) use of anticoagulants: Secondary | ICD-10-CM | POA: Diagnosis not present

## 2023-11-18 DIAGNOSIS — Z79899 Other long term (current) drug therapy: Secondary | ICD-10-CM | POA: Diagnosis not present

## 2023-11-18 DIAGNOSIS — Z1231 Encounter for screening mammogram for malignant neoplasm of breast: Secondary | ICD-10-CM | POA: Diagnosis not present

## 2023-11-18 DIAGNOSIS — M810 Age-related osteoporosis without current pathological fracture: Secondary | ICD-10-CM | POA: Diagnosis not present

## 2023-11-18 DIAGNOSIS — G4733 Obstructive sleep apnea (adult) (pediatric): Secondary | ICD-10-CM | POA: Diagnosis not present

## 2023-11-18 DIAGNOSIS — Z6822 Body mass index (BMI) 22.0-22.9, adult: Secondary | ICD-10-CM | POA: Diagnosis not present

## 2023-11-18 DIAGNOSIS — I480 Paroxysmal atrial fibrillation: Secondary | ICD-10-CM | POA: Diagnosis not present

## 2023-11-26 ENCOUNTER — Ambulatory Visit: Payer: Medicare Other | Attending: Cardiology | Admitting: Cardiology

## 2023-11-26 ENCOUNTER — Encounter: Payer: Self-pay | Admitting: Cardiology

## 2023-11-26 VITALS — BP 118/78 | HR 55 | Ht 67.0 in | Wt 147.0 lb

## 2023-11-26 DIAGNOSIS — I480 Paroxysmal atrial fibrillation: Secondary | ICD-10-CM | POA: Diagnosis not present

## 2023-11-26 DIAGNOSIS — I340 Nonrheumatic mitral (valve) insufficiency: Secondary | ICD-10-CM | POA: Insufficient documentation

## 2023-11-26 NOTE — Progress Notes (Signed)
    Cardiology Office Note  Date: 11/26/2023   ID: Samyra, Digaetano [birth year], MRN [id number]  History of Present Illness: Gloria Stewart is a 72 y.o. female last seen in May.  She is here for a follow-up visit.  Overall doing well without any progressive sense of palpitations.  Apple watch generally indicates less than 2% atrial fibrillation burden (had a 6% reading at one point but not consistently).  I reviewed her medications.  Current cardiac regimen includes Eliquis, Toprol-XL, flecainide, and Lipitor.  She does not report any spontaneous bleeding problems on Eliquis.  Tolerating medical therapy well.  We discussed getting an updated echocardiogram in comparison to prior study from 2022.  Physical Exam: VS:  BP 118/78 (BP Location: Right Arm)   Pulse (!) 55   Ht 5\' 7"  (1.702 m)   Wt 147 lb (66.7 kg)   SpO2 97%   BMI 23.02 kg/m , BMI Body mass index is 23.02 kg/m.  Wt Readings from Last 3 Encounters:  11/26/23 147 lb (66.7 kg)  10/14/23 145 lb (65.8 kg)  10/04/23 145 lb (65.8 kg)    General: Patient appears comfortable at rest. HEENT: Conjunctiva and lids normal. Cardiac: Regular rate and rhythm, no S3, 1/6 systolic murmur, no pericardial rub.  ECG:  An ECG dated 05/20/2023 was personally reviewed today and demonstrated:  Sinus bradycardia.  Labwork:  November 2024: Hemoglobin 12.4, platelets 177, BUN 23, creatinine 0.83, potassium 4.9, AST 29, ALT 19, cholesterol 170, triglycerides 71, HDL 53, LDL 103  Other Studies Reviewed Today:  No interval cardiac testing for review today.  Assessment and Plan:  1.  Paroxysmal atrial fibrillation with CHA2DS2-VASc score of 2.  Doing well without progressive palpitations and relatively low rhythm burden based on Apple Watch measurements.  Continue Toprol-XL and flecainide.  No bleeding problems on Eliquis.  I reviewed her recent lab work.   2.  Mitral and tricuspid regurgitation, mild to moderate by echocardiogram in  October 2022.  Plan to update echocardiogram this year.  Disposition:  Follow up  6 months.  Signed, Jonelle Sidle, M.D., F.A.C.C. [facility] at [facility]

## 2023-11-26 NOTE — Patient Instructions (Addendum)

## 2023-11-27 ENCOUNTER — Ambulatory Visit: Payer: Medicare Other | Attending: Cardiology

## 2023-11-27 ENCOUNTER — Ambulatory Visit: Payer: Medicare Other | Admitting: Cardiology

## 2023-11-27 DIAGNOSIS — I340 Nonrheumatic mitral (valve) insufficiency: Secondary | ICD-10-CM | POA: Insufficient documentation

## 2023-11-27 LAB — ECHOCARDIOGRAM COMPLETE
AR max vel: 2.92 cm2
AV Area VTI: 2.95 cm2
AV Area mean vel: 2.9 cm2
AV Mean grad: 8 mm[Hg]
AV Peak grad: 12.8 mm[Hg]
Ao pk vel: 1.79 m/s
Area-P 1/2: 2.88 cm2
Calc EF: 63.3 %
MV M vel: 5.13 m/s
MV Peak grad: 105.3 mm[Hg]
MV VTI: 3.57 cm2
S' Lateral: 3.4 cm
Single Plane A2C EF: 66.7 %
Single Plane A4C EF: 61.7 %

## 2023-12-05 ENCOUNTER — Other Ambulatory Visit: Payer: Self-pay | Admitting: Cardiology

## 2024-01-06 ENCOUNTER — Other Ambulatory Visit (HOSPITAL_COMMUNITY): Payer: Self-pay | Admitting: Internal Medicine

## 2024-01-06 ENCOUNTER — Ambulatory Visit (INDEPENDENT_AMBULATORY_CARE_PROVIDER_SITE_OTHER): Payer: Medicare Other

## 2024-01-22 ENCOUNTER — Telehealth: Payer: Self-pay

## 2024-01-22 NOTE — Telephone Encounter (Signed)
Auth Submission: NO AUTH NEEDED Site of care: Site of care: [facility] Payer: medicare a/b, bcbs supp Medication & CPT/J Code(s) submitted: Prolia (Denosumab) [id number] Route of submission (phone, fax, portal): portal Phone # Fax # Auth type: Buy/Bill HB Units/visits requested: 60mg ,q84months Reference number:  Approval from: 01/22/24 to 12/30/24

## 2024-04-09 ENCOUNTER — Other Ambulatory Visit: Payer: Self-pay

## 2024-05-07 ENCOUNTER — Other Ambulatory Visit: Payer: Self-pay | Admitting: Cardiology

## 2024-05-07 NOTE — Telephone Encounter (Signed)
 Prescription refill request for Eliquis  received. Indication: afib  Last office visit: [place] 11/26/2023 Scr: 0.83, 11/12/2023 Age: 73 yo  Weight: 66.7 kg   Refill sent.

## 2024-05-11 ENCOUNTER — Ambulatory Visit: Payer: Medicare Other | Admitting: Cardiology

## 2024-05-12 DIAGNOSIS — E782 Mixed hyperlipidemia: Secondary | ICD-10-CM | POA: Diagnosis not present

## 2024-05-12 DIAGNOSIS — E559 Vitamin D deficiency, unspecified: Secondary | ICD-10-CM | POA: Diagnosis not present

## 2024-05-18 ENCOUNTER — Encounter: Payer: Medicare Other | Attending: Internal Medicine | Admitting: Emergency Medicine

## 2024-05-18 VITALS — BP 124/52 | HR 52 | Temp 97.8°F | Resp 16

## 2024-05-18 DIAGNOSIS — E782 Mixed hyperlipidemia: Secondary | ICD-10-CM | POA: Diagnosis not present

## 2024-05-18 DIAGNOSIS — M810 Age-related osteoporosis without current pathological fracture: Secondary | ICD-10-CM | POA: Diagnosis not present

## 2024-05-18 DIAGNOSIS — G4733 Obstructive sleep apnea (adult) (pediatric): Secondary | ICD-10-CM | POA: Diagnosis not present

## 2024-05-18 DIAGNOSIS — N3281 Overactive bladder: Secondary | ICD-10-CM | POA: Diagnosis not present

## 2024-05-18 DIAGNOSIS — E559 Vitamin D deficiency, unspecified: Secondary | ICD-10-CM | POA: Diagnosis not present

## 2024-05-18 DIAGNOSIS — R059 Cough, unspecified: Secondary | ICD-10-CM | POA: Diagnosis not present

## 2024-05-18 DIAGNOSIS — I480 Paroxysmal atrial fibrillation: Secondary | ICD-10-CM | POA: Diagnosis not present

## 2024-05-18 MED ORDER — DENOSUMAB 60 MG/ML ~~LOC~~ SOSY
60.0000 mg | PREFILLED_SYRINGE | Freq: Once | SUBCUTANEOUS | Status: AC
  Administered 2024-05-18: 60 mg via SUBCUTANEOUS

## 2024-05-18 NOTE — Progress Notes (Signed)
 Diagnosis: Osteoporosis  Provider:  Izetta Marshall MD  Procedure: Injection  Prolia  (Denosumab ), Dose: 60 mg, Site: subcutaneous, Number of injections: 1  Injection Site(s): Right arm  Post Care: Observation period completed  Discharge: Condition: Good, Destination: Home . AVS Provided  Performed by:  Rico Charters, RN

## 2024-06-06 ENCOUNTER — Other Ambulatory Visit: Payer: Self-pay | Admitting: Cardiology

## 2024-06-15 ENCOUNTER — Encounter: Payer: Self-pay | Admitting: Cardiology

## 2024-06-15 ENCOUNTER — Ambulatory Visit: Payer: Medicare Other | Attending: Cardiology | Admitting: Cardiology

## 2024-06-15 VITALS — BP 122/72 | HR 50 | Ht 68.0 in | Wt 145.0 lb

## 2024-06-15 DIAGNOSIS — I480 Paroxysmal atrial fibrillation: Secondary | ICD-10-CM | POA: Diagnosis not present

## 2024-06-15 DIAGNOSIS — I340 Nonrheumatic mitral (valve) insufficiency: Secondary | ICD-10-CM | POA: Insufficient documentation

## 2024-06-15 NOTE — Progress Notes (Signed)
    Cardiology Office Note  Date: 06/15/2024   ID: Gloria Stewart, DOB [birth year], MRN [id number]  History of Present Illness: Gloria Stewart is a 73 y.o. female last seen in November 2024.  She is here for a routine visit.  Continues to do well, reports a rare sense of palpitations, no major functional limitation, continues to walk regularly for exercise.  We went over her medications.  She does not report any spontaneous bleeding problems on Eliquis .  Her blood pressure is normal today.  I did review her most recent lab work which is noted below.  I reviewed her ECG today which shows sinus bradycardia.  Physical Exam: VS:  BP 122/72   Pulse (!) 50   Ht 5' 8 (1.727 m)   Wt 145 lb (65.8 kg)   SpO2 98%   BMI 22.05 kg/m , BMI Body mass index is 22.05 kg/m.  Wt Readings from Last 3 Encounters:  06/15/24 145 lb (65.8 kg)  11/26/23 147 lb (66.7 kg)  10/14/23 145 lb (65.8 kg)    General: Patient appears comfortable at rest. HEENT: Conjunctiva and lids normal. Neck: Supple, no elevated JVP or carotid bruits. Lungs: Clear to auscultation, nonlabored breathing at rest. Cardiac: Regular rate and rhythm, no S3, 1/6 systolic murmur.  ECG:  An ECG dated 05/20/2023 was personally reviewed today and demonstrated:  Sinus bradycardia.  Labwork:  May 2025: Hemoglobin 12.9, platelets 202, BUN 22, creatinine 0.9, potassium 4.3, AST 20, ALT 16, cholesterol 178, triglycerides 78, HDL 60, LDL 103  Other Studies Reviewed Today:  No interval cardiac testing for review today.  Assessment and Plan:  1.  Paroxysmal atrial fibrillation with CHA2DS2-VASc score of 2.  Reports rare palpitations, ECG reviewed and stable.  Continue Toprol -XL 12.5 mg daily and flecainide  50 mg twice daily.  She is on Eliquis  5 mg twice daily for stroke prophylaxis.  No changes are made today.   2.  Mitral and tricuspid regurgitation, mild to moderate by follow-up echocardiogram in November 2024.  Findings are stable  and she is asymptomatic.  Disposition:  Follow up 6 months.  Signed, Gerard Knight, M.D., F.A.C.C. [facility] at [facility]

## 2024-06-15 NOTE — Patient Instructions (Signed)
Medication Instructions:  Your physician recommends that you continue on your current medications as directed. Please refer to the Current Medication list given to you today.   Labwork: None  Testing/Procedures: None  Follow-Up: 6 months  Any Other Special Instructions Will Be Listed Below (If Applicable).     If you need a refill on your cardiac medications before your next appointment, please call your pharmacy.

## 2024-07-02 DIAGNOSIS — H43393 Other vitreous opacities, bilateral: Secondary | ICD-10-CM | POA: Diagnosis not present

## 2024-08-06 DIAGNOSIS — Z1283 Encounter for screening for malignant neoplasm of skin: Secondary | ICD-10-CM | POA: Diagnosis not present

## 2024-08-06 DIAGNOSIS — L720 Epidermal cyst: Secondary | ICD-10-CM | POA: Diagnosis not present

## 2024-08-06 DIAGNOSIS — D225 Melanocytic nevi of trunk: Secondary | ICD-10-CM | POA: Diagnosis not present

## 2024-11-06 ENCOUNTER — Other Ambulatory Visit: Payer: Self-pay | Admitting: Cardiology

## 2024-11-06 DIAGNOSIS — I480 Paroxysmal atrial fibrillation: Secondary | ICD-10-CM

## 2024-11-06 NOTE — Telephone Encounter (Signed)
 Eliquis  5mg  refill request received. Patient is 73 years old, weight-65.8kg, Crea-0.90 on 05/12/24 via [employer] from Dr. Shona, Diagnosis-Afib, and last seen by Dr. Debera on 06/15/24. Dose is appropriate based on dosing criteria. Will send in refill to requested pharmacy.

## 2024-11-12 DIAGNOSIS — E782 Mixed hyperlipidemia: Secondary | ICD-10-CM | POA: Diagnosis not present

## 2024-11-12 DIAGNOSIS — E559 Vitamin D deficiency, unspecified: Secondary | ICD-10-CM | POA: Diagnosis not present

## 2024-11-18 ENCOUNTER — Other Ambulatory Visit (HOSPITAL_COMMUNITY): Payer: Self-pay | Admitting: Internal Medicine

## 2024-11-18 DIAGNOSIS — Z0000 Encounter for general adult medical examination without abnormal findings: Secondary | ICD-10-CM | POA: Diagnosis not present

## 2024-11-18 DIAGNOSIS — M810 Age-related osteoporosis without current pathological fracture: Secondary | ICD-10-CM | POA: Diagnosis not present

## 2024-11-18 DIAGNOSIS — E559 Vitamin D deficiency, unspecified: Secondary | ICD-10-CM | POA: Diagnosis not present

## 2024-11-18 DIAGNOSIS — Z7189 Other specified counseling: Secondary | ICD-10-CM | POA: Diagnosis not present

## 2024-11-18 DIAGNOSIS — E782 Mixed hyperlipidemia: Secondary | ICD-10-CM | POA: Diagnosis not present

## 2024-11-18 DIAGNOSIS — Z1231 Encounter for screening mammogram for malignant neoplasm of breast: Secondary | ICD-10-CM

## 2024-11-18 DIAGNOSIS — Z79899 Other long term (current) drug therapy: Secondary | ICD-10-CM | POA: Diagnosis not present

## 2024-11-18 DIAGNOSIS — I480 Paroxysmal atrial fibrillation: Secondary | ICD-10-CM | POA: Diagnosis not present

## 2024-11-18 DIAGNOSIS — Z1211 Encounter for screening for malignant neoplasm of colon: Secondary | ICD-10-CM | POA: Diagnosis not present

## 2024-11-19 ENCOUNTER — Other Ambulatory Visit (HOSPITAL_COMMUNITY): Payer: Self-pay | Admitting: Family Medicine

## 2024-11-19 ENCOUNTER — Encounter: Attending: Internal Medicine | Admitting: *Deleted

## 2024-11-19 VITALS — BP 108/69 | HR 52 | Temp 97.4°F | Resp 16

## 2024-11-19 DIAGNOSIS — M810 Age-related osteoporosis without current pathological fracture: Secondary | ICD-10-CM

## 2024-11-19 MED ORDER — DENOSUMAB 60 MG/ML ~~LOC~~ SOSY
60.0000 mg | PREFILLED_SYRINGE | Freq: Once | SUBCUTANEOUS | Status: AC
  Administered 2024-11-19: 60 mg via SUBCUTANEOUS

## 2024-11-19 NOTE — Progress Notes (Signed)
 Diagnosis: Osteoporosis  Provider:  Hall, Zack MD  Procedure: Injection  Prolia  (Denosumab ), Dose: 60 mg, Site: subcutaneous, Number of injections: 1  Injection Site(s): Left arm  Post Care: Observation period completed  Discharge: Condition: Good, Destination: Home . AVS Declined  Performed by:  Baldwin Darice Helling, RN

## 2024-11-23 ENCOUNTER — Encounter: Payer: Self-pay | Admitting: Gastroenterology

## 2024-12-02 ENCOUNTER — Ambulatory Visit (HOSPITAL_COMMUNITY)
Admission: RE | Admit: 2024-12-02 | Discharge: 2024-12-02 | Disposition: A | Source: Ambulatory Visit | Attending: Internal Medicine | Admitting: Internal Medicine

## 2024-12-02 ENCOUNTER — Ambulatory Visit (HOSPITAL_COMMUNITY)
Admission: RE | Admit: 2024-12-02 | Discharge: 2024-12-02 | Disposition: A | Source: Ambulatory Visit | Attending: Family Medicine | Admitting: Family Medicine

## 2024-12-02 DIAGNOSIS — Z780 Asymptomatic menopausal state: Secondary | ICD-10-CM | POA: Diagnosis not present

## 2024-12-02 DIAGNOSIS — M810 Age-related osteoporosis without current pathological fracture: Secondary | ICD-10-CM

## 2024-12-02 DIAGNOSIS — Z1231 Encounter for screening mammogram for malignant neoplasm of breast: Secondary | ICD-10-CM | POA: Diagnosis not present

## 2024-12-04 ENCOUNTER — Ambulatory Visit: Admitting: Cardiology

## 2024-12-07 ENCOUNTER — Ambulatory Visit: Admitting: Cardiology

## 2024-12-09 ENCOUNTER — Other Ambulatory Visit: Payer: Self-pay | Admitting: Cardiology

## 2024-12-30 ENCOUNTER — Encounter: Payer: Self-pay | Admitting: Cardiology

## 2024-12-30 ENCOUNTER — Ambulatory Visit: Attending: Cardiology | Admitting: Cardiology

## 2024-12-30 VITALS — BP 120/74 | HR 53 | Ht 68.0 in | Wt 150.8 lb

## 2024-12-30 DIAGNOSIS — I340 Nonrheumatic mitral (valve) insufficiency: Secondary | ICD-10-CM | POA: Insufficient documentation

## 2024-12-30 DIAGNOSIS — I480 Paroxysmal atrial fibrillation: Secondary | ICD-10-CM | POA: Insufficient documentation

## 2024-12-30 NOTE — Patient Instructions (Signed)
 Medication Instructions:  Continue all current medications.   Labwork: none  Testing/Procedures: none  Follow-Up: 6 months   Any Other Special Instructions Will Be Listed Below (If Applicable).   If you need a refill on your cardiac medications before your next appointment, please call your pharmacy.

## 2024-12-30 NOTE — Progress Notes (Signed)
"  ° ° °  Cardiology Office Note  Date: 12/30/2024   ID: Gloria Stewart, Gloria Stewart [birth year], MRN [id number]  History of Present Illness: Gloria Stewart is a 73 y.o. female last seen in June.  She is here for a follow-up visit.  She does not describe any significant palpitations, no exertional chest pain or syncope.  I went over her medications.  No reported bleeding problems on Eliquis .  She had an ECG in June which I reviewed as well.  She continues to follow-up with Dr. Milford office, I reviewed her lab work from November.  Physical Exam: VS:  BP 120/74   Pulse (!) 53   Ht 5' 8 (1.727 m)   Wt 150 lb 12.8 oz (68.4 kg)   SpO2 90%   BMI 22.93 kg/m , BMI Body mass index is 22.93 kg/m.  Wt Readings from Last 3 Encounters:  12/30/24 150 lb 12.8 oz (68.4 kg)  06/15/24 145 lb (65.8 kg)  11/26/23 147 lb (66.7 kg)    General: Patient appears comfortable at rest. HEENT: Conjunctiva and lids normal. Neck: Supple, no elevated JVP or carotid bruits. Lungs: Clear to auscultation, nonlabored breathing at rest. Cardiac: Regular rate and rhythm, no S3 or significant systolic murmur.  ECG:  An ECG dated 06/15/2024 was personally reviewed today and demonstrated:  Sinus bradycardia.  Labwork:  November 2025: Hemoglobin 13.1, platelets 218, BUN 21, creatinine 0.8, potassium 4.6, AST 19, ALT 19, cholesterol 202, triglycerides 76, HDL 70, LDL 118  Other Studies Reviewed Today:  No interval cardiac testing for review today.  Assessment and Plan:  1.  Paroxysmal atrial fibrillation with CHA2DS2-VASc score of 2.  Asymptomatic with no interval palpitations.  I reviewed her lab work from November.  Continue flecainide  75 mg twice daily and Toprol  XL 12.5 mg daily.  She continues on Eliquis  5 mg twice daily for stroke prophylaxis.  Update ECG for next visit.   2.  Mitral and tricuspid regurgitation, mild to moderate by follow-up echocardiogram in November 2024.  No significant change in cardiac exam.   She is asymptomatic.  Disposition:  Follow up 6 months.  Signed, Jayson JUDITHANN Sierras, M.D., F.A.C.C. [facility] at [facility]

## 2025-01-12 ENCOUNTER — Ambulatory Visit: Admitting: Gastroenterology

## 2025-01-12 ENCOUNTER — Encounter: Payer: Self-pay | Admitting: Gastroenterology

## 2025-01-12 ENCOUNTER — Telehealth: Payer: Self-pay | Admitting: Gastroenterology

## 2025-01-12 VITALS — BP 110/70 | HR 55 | Temp 98.2°F | Ht 68.0 in | Wt 149.0 lb

## 2025-01-12 DIAGNOSIS — K5909 Other constipation: Secondary | ICD-10-CM | POA: Diagnosis not present

## 2025-01-12 DIAGNOSIS — K5904 Chronic idiopathic constipation: Secondary | ICD-10-CM

## 2025-01-12 NOTE — Patient Instructions (Addendum)
 For your constipation I want you to slowly increase your fiber intake in regards to your Metamucil/psyllium.  If you are taking 2 capsules daily then you can increase up to 5/day if you would like.   To help with more frequency and easier bowel movements want you to start taking MiraLAX  (polyethylene glycol)17 g which is 1 capful in at least 8 ounces of noncarbonated beverage of your choice daily but if you feel like this is too much then you can decrease the frequency as you feel necessary such as every other day or half a capful daily, etc. in regards to the MiraLAX you can get any over-the-counter generic such as ClearLax, purelax, smooth lax depending on which drugstore you buy this from.  If you still find yourself needing to strain or having to push a lot and decide that you would like to proceed with pelvic floor physical therapy and please call the office and let me know.  We will plan for tentative follow-up in 6 months but do not hesitate to make an appointment sooner if you are still struggling with your constipation.  It was a pleasure to see you today. I want to create trusting relationships with patients. If you receive a survey regarding your visit,  I greatly appreciate you taking time to fill this out on paper or through your MyChart. I value your feedback.  Charmaine Melia, MSN, FNP-BC, AGACNP-BC [facility]

## 2025-01-12 NOTE — Telephone Encounter (Signed)
 Please place on recall OV January 2027 to discuss scheduling colonoscopy given on blood thinner.

## 2025-01-12 NOTE — Progress Notes (Signed)
 "  GI Office Note    Referring Provider: Jolee Eudora HERO, FNP Primary Care Physician:  Jolee Eudora HERO, FNP Primary Gastroenterologist: Lamar HERO.Rourk, MD  Date:  01/12/2025  ID:  Gloria Stewart, DOB Nov 26, 1951, MRN [id number]  Chief Complaint   Chief Complaint  Patient presents with   Colonoscopy    Colonoscopy screening. Constipation    History of Present Illness  Gloria Stewart is a 74 y.o. female with a history of valvular heart disease, paroxysmal A-fib on Eliquis , HLD presenting today with complaint of constipation and need to schedule colonoscopy.  Colonoscopy October 2006 with normal rectum, sigmoid diverticula. Normal colonic mucosa, recommend repeat in 10 years.  Last office visit 2017 with some constipation history.  She has reported she has cut around holidays with an interruption in her normal routine but given she was back to walking with adequate water  and fiber intake she was no longer constipated.  Scheduled for repeat colonoscopy.  Colonoscopy February 2017: - Colonic diverticulosis - Redundant colon - 3 mm AVM in the descending colon - Repeat colonoscopy in 10 years.  Today: Discussed the use of AI scribe software for clinical note transcription with the patient, who gave verbal consent to proceed.  Chronic constipation has been present throughout her life, with bowel movements typically every other or every third day. Recently, frequency has decreased to every third day, accompanied by bothersome straining during defecation. Stool consistency remains soft, but she perceives decreased muscle strength throughout her body and reports that evacuation has become more difficult.  Diet consists of regular water  intake, four prunes daily, kiwi, rye bread, cheese, and chocolate. Daily psyllium fiber supplementation improves bowel habits, and missed doses, such as during travel, worsen constipation. Dulcolax is used if bowel movements extend beyond three days. She has not  tried Miralax.  She denies abdominal pain, hematochezia, melena, change in appetite, weight loss, nausea, vomiting, acid reflux, dysphagia, dizziness, or lightheadedness. Appetite is described as wonderful and weight is stable.  Expresses concern about falling due to osteoporosis and the potential need for assisted living if a fall occurs.      Wt Readings from Last 6 Encounters:  01/12/25 149 lb (67.6 kg)  12/30/24 150 lb 12.8 oz (68.4 kg)  06/15/24 145 lb (65.8 kg)  11/26/23 147 lb (66.7 kg)  10/14/23 145 lb (65.8 kg)  10/04/23 145 lb (65.8 kg)    Body mass index is 22.66 kg/m.   Current Outpatient Medications  Medication Sig Dispense Refill   atorvastatin (LIPITOR) 10 MG tablet Take 10 mg by mouth as directed. Monday and Friday only     calcium carbonate (OS-CAL - DOSED IN MG OF ELEMENTAL CALCIUM) 1250 (500 Ca) MG tablet Take 1 tablet by mouth.     cholecalciferol (VITAMIN D) 1000 units tablet Take 1,000 Units by mouth daily.     denosumab  (PROLIA ) 60 MG/ML SOSY injection Inject 60 mg into the skin every 6 (six) months.     ELIQUIS  5 MG TABS tablet TAKE 1 TABLET(5 MG) BY MOUTH TWICE DAILY 60 tablet 5   flecainide  (TAMBOCOR ) 50 MG tablet TAKE 1 AND 1/2 TABLETS(75 MG) BY MOUTH TWICE DAILY 270 tablet 1   metoprolol  succinate (TOPROL -XL) 25 MG 24 hr tablet TAKE 1/2 TABLET(12.5 MG) BY MOUTH DAILY 45 tablet 1   No current facility-administered medications for this visit.    Past Medical History:  Diagnosis Date   Hyperlipidemia    Osteoporosis    Paroxysmal atrial fibrillation (HCC)  Preglaucoma    Valvular heart disease     Past Surgical History:  Procedure Laterality Date   CATARACT EXTRACTION W/PHACO Right 10/04/2023   Procedure: CATARACT EXTRACTION PHACO AND INTRAOCULAR LENS PLACEMENT (IOC);  Surgeon: Harrie Agent, MD;  Location: [facility];  Service: Ophthalmology;  Laterality: Right;  CDE: 8.79   CATARACT EXTRACTION W/PHACO Left 10/14/2023   Procedure: CATARACT  EXTRACTION PHACO AND INTRAOCULAR LENS PLACEMENT (IOC);  Surgeon: Harrie Agent, MD;  Location: [facility];  Service: Ophthalmology;  Laterality: Left;  CDE: 8.47   COLONOSCOPY N/A 02/09/2016   Procedure: COLONOSCOPY;  Surgeon: Lamar CHRISTELLA Hollingshead, MD;  Location: [facility];  Service: Endoscopy;  Laterality: N/A;  900 - moved to 10:15 - office to notify   TONSILLECTOMY     at 74 years old    Family History  Problem Relation Age of Onset   Hodgkin's lymphoma Mother    Cirrhosis Father    Colon cancer Neg Hx     Allergies as of 01/12/2025   (No Known Allergies)    Social History   Socioeconomic History   Marital status: Single    Spouse name: Not on file   Number of children: Not on file   Years of education: Not on file   Highest education level: Not on file  Occupational History   Not on file  Tobacco Use   Smoking status: Former    Current packs/day: 0.00    Types: Cigarettes    Quit date: 01/16/1981    Years since quitting: 44.0   Smokeless tobacco: Never  Vaping Use   Vaping status: Never Used  Substance and Sexual Activity   Alcohol use: Yes   Drug use: No   Sexual activity: Not Currently  Other Topics Concern   Not on file  Social History Narrative   Not on file   Social Drivers of Health   Tobacco Use: Medium Risk (01/12/2025)   Patient History    Smoking Tobacco Use: Former    Smokeless Tobacco Use: Never    Passive Exposure: Not on Actuary Strain: Not on file  Food Insecurity: Not on file  Transportation Needs: Not on file  Physical Activity: Not on file  Stress: Not on file  Social Connections: Not on file  Depression (PHQ2-9): Low Risk (11/19/2024)   Depression (PHQ2-9)    PHQ-2 Score: 0  Alcohol Screen: Not on file  Housing: Not on file  Utilities: Not on file  Health Literacy: Not on file    Review of Systems   Gen: Denies fever, chills, anorexia. Denies fatigue, weakness, weight loss.  CV: Denies chest pain, palpitations,  syncope, peripheral edema, and claudication. Resp: Denies dyspnea at rest, cough, wheezing, coughing up blood, and pleurisy. GI: See HPI Derm: Denies rash, itching, dry skin Psych: Denies depression, anxiety, memory loss, confusion. No homicidal or suicidal ideation.  Heme: Denies bruising, bleeding, and enlarged lymph nodes.  Physical Exam   BP 110/70 (BP Location: Right Arm, Patient Position: Sitting, Cuff Size: Normal)   Pulse (!) 55   Temp 98.2 F (36.8 C) (Temporal)   Ht 5' 8 (1.727 m)   Wt 149 lb (67.6 kg)   BMI 22.66 kg/m   General:   Alert and oriented. No distress noted. Pleasant and cooperative.  Head:  Normocephalic and atraumatic. Eyes:  Conjuctiva clear without scleral icterus. Abdomen:  +BS, soft, non-tender and non-distended. No rebound or guarding. No HSM or masses noted. Rectal: deferred Msk:  Symmetrical  without gross deformities. Normal posture. Extremities:  Without edema. Neurologic:  Alert and  oriented x4 Psych:  Alert and cooperative. Normal mood and affect.  Assessment & Plan  Gloria Stewart is a 74 y.o. female presenting today for constipation.  Presented originally to discuss getting a colonoscopy given she is on blood thinner however not and not having any alarm symptoms to warrant scheduling early interval colonoscopy.     Chronic constipation Longstanding, non-acute constipation characterized by regular but infrequent bowel movements and associated straining, without alarm features. Recent worsening of symptoms.  - Recommended increasing fiber supplementation (metamucil/psyllium), up to five capsules per dose as tolerated. - Advised trial of daily or every other day of polyethylene glycol (Miralax) to improve stool regularity and ease of passage. - Discussed pelvic floor physical therapy for muscle weakness and coordination issues contributing to straining; offered referral to women's outpatient clinic in [place] if she wishes to pursue this. -  Provided anticipatory guidance to monitor for new symptoms such as weight loss, hematochezia, or significant changes in bowel habits, which would prompt earlier colonoscopy. - Will send a reminder letter and questionnaire a few months prior to colonoscopy that is due February 2027.      Follow up   Recall of the visit in 6 months, doing well was not needed.  Can plan for office visit in January 2027 to discuss scheduling her surveillance colonoscopy.   Charmaine Melia, MSN, FNP-BC, AGACNP-BC [facility] "

## 2025-05-25 ENCOUNTER — Ambulatory Visit

## 2025-06-21 ENCOUNTER — Ambulatory Visit: Admitting: Cardiology
